# Patient Record
Sex: Female | Born: 1975 | Race: White | Hispanic: No | Marital: Married | Smoking: Current every day smoker
Health system: Southern US, Community
[De-identification: ages and names within clinical notes are randomized; demographics above are authoritative.]

## PROBLEM LIST (undated history)

## (undated) DIAGNOSIS — I2510 Atherosclerotic heart disease of native coronary artery without angina pectoris: Secondary | ICD-10-CM

## (undated) DIAGNOSIS — I10 Essential (primary) hypertension: Secondary | ICD-10-CM

## (undated) DIAGNOSIS — R569 Unspecified convulsions: Secondary | ICD-10-CM

## (undated) DIAGNOSIS — I509 Heart failure, unspecified: Secondary | ICD-10-CM

## (undated) HISTORY — PX: ABDOMINAL HYSTERECTOMY: SHX81

---

## 2005-07-12 ENCOUNTER — Emergency Department: Payer: Self-pay | Admitting: Emergency Medicine

## 2006-04-09 ENCOUNTER — Emergency Department: Payer: Self-pay | Admitting: Emergency Medicine

## 2006-05-07 ENCOUNTER — Ambulatory Visit: Payer: Self-pay | Admitting: Obstetrics and Gynecology

## 2006-05-13 ENCOUNTER — Inpatient Hospital Stay: Payer: Self-pay | Admitting: Obstetrics and Gynecology

## 2007-05-08 ENCOUNTER — Emergency Department: Payer: Self-pay | Admitting: Emergency Medicine

## 2007-07-01 ENCOUNTER — Emergency Department: Payer: Self-pay | Admitting: Emergency Medicine

## 2007-07-14 ENCOUNTER — Ambulatory Visit: Payer: Self-pay | Admitting: Urology

## 2007-09-16 ENCOUNTER — Emergency Department: Payer: Self-pay

## 2008-02-10 ENCOUNTER — Observation Stay: Payer: Self-pay | Admitting: Obstetrics and Gynecology

## 2008-07-04 ENCOUNTER — Emergency Department: Payer: Self-pay | Admitting: Emergency Medicine

## 2008-09-04 ENCOUNTER — Ambulatory Visit: Payer: Self-pay | Admitting: Family Medicine

## 2009-02-21 ENCOUNTER — Emergency Department: Payer: Self-pay | Admitting: Emergency Medicine

## 2009-03-07 ENCOUNTER — Encounter: Admission: RE | Admit: 2009-03-07 | Discharge: 2009-03-07 | Payer: Self-pay | Admitting: Surgery

## 2009-11-17 ENCOUNTER — Emergency Department: Payer: Self-pay | Admitting: Internal Medicine

## 2011-02-27 ENCOUNTER — Emergency Department: Payer: Self-pay | Admitting: Emergency Medicine

## 2011-07-05 ENCOUNTER — Emergency Department: Payer: Self-pay | Admitting: Unknown Physician Specialty

## 2011-11-22 ENCOUNTER — Encounter: Payer: Self-pay | Admitting: Emergency Medicine

## 2011-11-22 ENCOUNTER — Emergency Department (HOSPITAL_COMMUNITY)
Admission: EM | Admit: 2011-11-22 | Discharge: 2011-11-22 | Disposition: A | Discharge status: Home / Self Care | Payer: Self-pay | Attending: Emergency Medicine | Admitting: Emergency Medicine

## 2011-11-22 DIAGNOSIS — R109 Unspecified abdominal pain: Secondary | ICD-10-CM | POA: Insufficient documentation

## 2011-11-22 DIAGNOSIS — G8929 Other chronic pain: Secondary | ICD-10-CM | POA: Insufficient documentation

## 2011-11-22 DIAGNOSIS — R300 Dysuria: Secondary | ICD-10-CM | POA: Insufficient documentation

## 2011-11-22 DIAGNOSIS — F17200 Nicotine dependence, unspecified, uncomplicated: Secondary | ICD-10-CM | POA: Insufficient documentation

## 2011-11-22 DIAGNOSIS — Z79899 Other long term (current) drug therapy: Secondary | ICD-10-CM | POA: Insufficient documentation

## 2011-11-22 DIAGNOSIS — R110 Nausea: Secondary | ICD-10-CM | POA: Insufficient documentation

## 2011-11-22 DIAGNOSIS — R10819 Abdominal tenderness, unspecified site: Secondary | ICD-10-CM | POA: Insufficient documentation

## 2011-11-22 LAB — URINALYSIS, ROUTINE W REFLEX MICROSCOPIC
Bilirubin Urine: NEGATIVE
Nitrite: NEGATIVE
Specific Gravity, Urine: 1.005 (ref 1.005–1.030)
pH: 7 (ref 5.0–8.0)

## 2011-11-22 LAB — POCT I-STAT, CHEM 8
Creatinine, Ser: 1 mg/dL (ref 0.50–1.10)
Glucose, Bld: 92 mg/dL (ref 70–99)
Hemoglobin: 15 g/dL (ref 12.0–15.0)
TCO2: 27 mmol/L (ref 0–100)

## 2011-11-22 LAB — URINE MICROSCOPIC-ADD ON

## 2011-11-22 LAB — POCT PREGNANCY, URINE: Preg Test, Ur: NEGATIVE

## 2011-11-22 MED ORDER — CIPROFLOXACIN HCL 500 MG PO TABS: 500.0000 mg | ORAL_TABLET | Freq: Two times a day (BID) | ORAL | Status: AC

## 2011-11-22 MED ORDER — HYDROCODONE-ACETAMINOPHEN 5-325 MG PO TABS: 2.0000 | ORAL_TABLET | ORAL | Status: AC | PRN

## 2011-11-22 MED ORDER — HYDROCODONE-ACETAMINOPHEN 5-325 MG PO TABS
1.0000 | ORAL_TABLET | Freq: Once | ORAL | Status: AC
  Administered 2011-11-22: 1 via ORAL
  Filled 2011-11-22: qty 1

## 2011-11-22 NOTE — ED Provider Notes (Signed)
History     CSN: [id number] Arrival date & time: 11/22/2011  6:52 PM   First MD Initiated Contact with Patient 11/22/11 1958      Chief Complaint  Patient presents with  . Flank Pain    HPI  History provided by the patient. Patient presents with complaints of bilateral lower flank and back pains and suprapubic pressure with associated dysuria for the past one to 2 months. She reports history of the same for many many years with recurrent kidney infections. She states she has been evaluated for this by urology specialist in [place] and at [facility]. She had some testing done but lost medical insurance and has not followed up for the past 6 months to one year. Patient reports that symptoms have gradually been increasing. She feels a off-and-on pressure and burning sensation. She denies any fever, chills, sweats, nausea or vomiting. Symptoms are worse with the first urination in the morning after waking up. She states she is to try to drink plenty of fluids and took naproxen without significant improvement. She denies any other alleviating factors. Patient has no other significant past medical history.   Past Medical History  Diagnosis Date  . Kidney infection     History reviewed. No pertinent past surgical history.  History reviewed. No pertinent family history.  History  Substance Use Topics  . Smoking status: Current Everyday Smoker  . Smokeless tobacco: Not on file  . Alcohol Use: No    OB History    Grav Para Term Preterm Abortions TAB SAB Ect Mult Living                  Review of Systems  Constitutional: Negative for fever and chills.  Respiratory: Negative for cough.   Gastrointestinal: Positive for nausea and abdominal pain. Negative for vomiting, diarrhea and constipation.  Genitourinary: Positive for dysuria and flank pain. Negative for frequency, hematuria, vaginal bleeding and vaginal discharge.  All other systems reviewed and are  negative.    Allergies  Amoxicillin and Penicillins  Home Medications   Current Outpatient Rx  Name Route Sig Dispense Refill  . ESTRADIOL 0.5 MG PO TABS Oral Take 0.5 mg by mouth at bedtime.      Marland Kitchen MEDROXYPROGESTERONE ACETATE 2.5 MG PO TABS Oral Take 2.5 mg by mouth at bedtime.      Marland Kitchen NAPROXEN 500 MG PO TABS Oral Take 500 mg by mouth 2 (two) times daily with a meal.        BP 133/80  Pulse 111  Temp(Src) 98.2 F (36.8 C) (Oral)  Resp 18  SpO2 100%  Physical Exam  Nursing note and vitals reviewed. Constitutional: She is oriented to person, place, and time. She appears well-developed and well-nourished. No distress.  HENT:  Head: Normocephalic.  Mouth/Throat: Oropharynx is clear and moist.  Cardiovascular: Normal rate, regular rhythm and normal heart sounds.   Pulmonary/Chest: Effort normal and breath sounds normal. She has no wheezes. She has no rales.  Abdominal: Soft. Bowel sounds are normal. There is tenderness in the suprapubic area. There is no rebound, no guarding and no CVA tenderness.  Musculoskeletal: She exhibits no edema and no tenderness.  Neurological: She is alert and oriented to person, place, and time.  Skin: Skin is warm. No rash noted.  Psychiatric: She has a normal mood and affect. Her behavior is normal.    ED Course  Procedures (including critical care time)  Labs Reviewed  URINALYSIS, ROUTINE W REFLEX MICROSCOPIC - Abnormal;  Notable for the following:    Color, Urine STRAW (*)    APPearance CLOUDY (*)    Hgb urine dipstick SMALL (*)    Leukocytes, UA TRACE (*)    All other components within normal limits  URINE MICROSCOPIC-ADD ON - Abnormal; Notable for the following:    Squamous Epithelial / LPF MANY (*)    Bacteria, UA MANY (*)    All other components within normal limits  POCT PREGNANCY, URINE  POCT I-STAT, CHEM 8  LAB REPORT - SCANNED    Results for orders placed during the hospital encounter of 11/22/11  URINALYSIS, ROUTINE W REFLEX  MICROSCOPIC      Component Value Range   Color, Urine STRAW (*) YELLOW    APPearance CLOUDY (*) CLEAR    Specific Gravity, Urine 1.005  1.005 - 1.030    pH 7.0  5.0 - 8.0    Glucose, UA NEGATIVE  NEGATIVE (mg/dL)   Hgb urine dipstick SMALL (*) NEGATIVE    Bilirubin Urine NEGATIVE  NEGATIVE    Ketones, ur NEGATIVE  NEGATIVE (mg/dL)   Protein, ur NEGATIVE  NEGATIVE (mg/dL)   Urobilinogen, UA 1.0  0.0 - 1.0 (mg/dL)   Nitrite NEGATIVE  NEGATIVE    Leukocytes, UA TRACE (*) NEGATIVE   POCT PREGNANCY, URINE      Component Value Range   Preg Test, Ur NEGATIVE    URINE MICROSCOPIC-ADD ON      Component Value Range   Squamous Epithelial / LPF MANY (*) RARE    WBC, UA 3-6  <3 (WBC/hpf)   RBC / HPF 3-6  <3 (RBC/hpf)   Bacteria, UA MANY (*) RARE   POCT I-STAT, CHEM 8      Component Value Range   Sodium 143  135 - 145 (mEq/L)   Potassium 3.7  3.5 - 5.1 (mEq/L)   Chloride 104  96 - 112 (mEq/L)   BUN 8  6 - 23 (mg/dL)   Creatinine, Ser 1.61  0.50 - 1.10 (mg/dL)   Glucose, Bld 92  70 - 99 (mg/dL)   Calcium, Ion 0.96  0.45 - 1.32 (mmol/L)   TCO2 27  0 - 100 (mmol/L)   Hemoglobin 15.0  12.0 - 15.0 (g/dL)   HCT 40.9  81.1 - 91.4 (%)      1. Dysuria   2. Chronic pain       MDM  8:30 patient seen and evaluated. Patient in no acute distress.   Pt feeling better.  Pt with no clear signs of UTI today.   Pt with hx of similar symptoms for many months.  Pt has been worked up for same complaints.  Pt has stable vital signs.  Her exam is benign and do not suspect any acute or emergent process.     Angus Seller, [place] 11/23/11 2158

## 2011-11-22 NOTE — ED Notes (Signed)
Rx given x2 D/c instructions reviewed w/ pt and family - pt and family deny any further questions or concerns at present.  

## 2011-11-22 NOTE — ED Notes (Signed)
Pt reports hx of kidney infections - reports foul smelling urine x1 month - states she feeling like she has a kidney infection however "its just not showing up in my urine." Pt denies fever, hematuria, or n/v. Pt admits to dysuria and bilat flank pain.

## 2011-11-22 NOTE — ED Notes (Signed)
Bilateral flank pain x 1 1/2 months.  Reports urine has strong odor.

## 2011-11-25 NOTE — ED Provider Notes (Signed)
Medical screening examination/treatment/procedure(s) were performed by non-physician practitioner and as supervising physician I was immediately available for consultation/collaboration.   Gwyneth Sprout, MD 11/25/11 [phone]

## 2012-07-21 ENCOUNTER — Emergency Department: Payer: Self-pay | Admitting: Emergency Medicine

## 2012-08-23 ENCOUNTER — Emergency Department: Payer: Self-pay | Admitting: Emergency Medicine

## 2012-08-25 ENCOUNTER — Emergency Department: Payer: Self-pay | Admitting: Emergency Medicine

## 2012-08-25 LAB — CBC WITH DIFFERENTIAL/PLATELET
Basophil #: 0.1 10*3/uL (ref 0.0–0.1)
Eosinophil #: 0.3 10*3/uL (ref 0.0–0.7)
HCT: 43.9 % (ref 35.0–47.0)
HGB: 14.9 g/dL (ref 12.0–16.0)
Lymphocyte #: 2.3 10*3/uL (ref 1.0–3.6)
MCH: 31.4 pg (ref 26.0–34.0)
MCHC: 33.9 g/dL (ref 32.0–36.0)
Monocyte #: 1 x10 3/mm — ABNORMAL HIGH (ref 0.2–0.9)
Neutrophil %: 54 %
Platelet: 220 10*3/uL (ref 150–440)
RBC: 4.76 10*6/uL (ref 3.80–5.20)
RDW: 12.7 % (ref 11.5–14.5)

## 2012-08-25 LAB — COMPREHENSIVE METABOLIC PANEL
Anion Gap: 8 (ref 7–16)
BUN: 7 mg/dL (ref 7–18)
Bilirubin,Total: 0.7 mg/dL (ref 0.2–1.0)
Chloride: 104 mmol/L (ref 98–107)
EGFR (African American): >60
Potassium: 3.2 mmol/L — ABNORMAL LOW (ref 3.5–5.1)
SGOT(AST): 17 U/L (ref 15–37)
Total Protein: 8.4 g/dL — ABNORMAL HIGH (ref 6.4–8.2)

## 2012-08-25 LAB — TSH: Thyroid Stimulating Horm: 0.67 u[IU]/mL

## 2012-12-01 ENCOUNTER — Emergency Department: Payer: Self-pay | Admitting: Emergency Medicine

## 2013-10-07 ENCOUNTER — Emergency Department: Payer: Self-pay | Admitting: Emergency Medicine

## 2014-10-25 ENCOUNTER — Emergency Department: Payer: Self-pay | Admitting: Student

## 2015-02-13 ENCOUNTER — Inpatient Hospital Stay: Payer: Self-pay | Admitting: Internal Medicine

## 2015-04-14 NOTE — H&P (Signed)
PATIENT NAME:  Tami Davis, Tami Davis MR#:  [id number] DATE OF BIRTH:  10-19-1976  DATE OF ADMISSION:  02/13/2015  PRIMARY CARE PHYSICIAN:  Dr. Dale Mayfield.  CHIEF COMPLAINT: Chest pain.   HISTORY OF PRESENTING ILLNESS: This is a 39 year old female who had hysterectomy and oophorectomy done because of endometriosis and she is on estrogen and progesterone replacement for the last 4 years. She has been a smoker for almost like 17-18 years now, smokes half pack cigarettes every day and her father had coronary artery disease at the age of 30.   Complains that for the last 2 to 3 days she is feeling pressure-like chest pain, which is in the center of the chest and sometimes it radiates to her left arm and left arm feels numb while trying to hold something occasionally also. She also feels excessive sweating and uneasiness with that and so finally decided to come to Emergency Room for further evaluation of this complaint. On further description she says that the pain is like somebody is sitting on my chest when I lie down flat in the bed and I feel a little bit better on turning to one side or sitting up but the pain never goes away, it just stays there. She feels a little bit short of breath, but denies any wheezing. She has slight worsening in her regular smokers cough and there is a little bit sputum production with that also but denies any chills, fever, or weakness, denies any palpitation.   On arrival to ER she was noted to have slight tachycardia, which resolved with IV fluid. Troponin was noted to be 0.5.  Other lab reports came out almost nearly normal or negative and so she was suspected to have non-ST-elevation MI or pericarditis and given to hospitalist team for further management of this issue. On further questioning, she also describes that in the last 7 to 8 months she had 2 episodes of suspected seizures. The first one she noted  after having some kind of dental procedure and medication for that.   Within 1 or 2 days at home she passed out and husband was there, he saw her completely unresponsive, then her eyes rolled up and she threw up having projectile vomiting. They tried to schedule an appointment to [facility] neurologic doctors but could not see them for months and meanwhile almost 1-1/2 months ago she had another episode, similar to the one when she was alone at home.  Never had any history of seizures or stroke.   REVIEW OF SYSTEMS:   CONSTITUTIONAL: Negative for fever, fatigue, weakness, pain or weight loss.  EYES: No blurring, double vision, discharge or redness.  EARS, NOSE, THROAT: No tinnitus, ear pain, or hearing loss.  RESPIRATORY: No cough, wheezing, hemoptysis, or shortness of breath.  CARDIOVASCULAR: The patient has some chest pain, but no palpitation, edema, arrhythmia.  GASTROINTESTINAL: No nausea, vomiting, diarrhea, abdominal pain.  GENITOURINARY: No dysuria, hematuria, increased frequency.  ENDOCRINE: No heat or cold intolerance. No excessive sweating.  SKIN: No acne, rashes, or lesions.  MUSCULOSKELETAL: No pain or swelling in the joints.  NEUROLOGICAL: No numbness, weakness, tremor or vertigo.  PSYCHIATRY: No anxiety, insomnia, or bipolar disorder.   PAST MEDICAL HISTORY:  1.  Hysterectomy and oophorectomy because of endometriosis and she is on hormonal replacement therapy for the last 4 years because of menopausal symptoms.  2.  Myalgia, taking gabapentin for that.   SURGICAL HISTORY:  GYN surgeries as mentioned above.   SOCIAL HISTORY: She  smokes 1/2 pack of cigarettes every day. Continues to smoke with her hormone replacement therapy.  Denies drinking alcohol or do illegal drug use. She is a housewife.   FAMILY HISTORY: Positive for father having MI at age of 81. Multiple family members have diabetes in family.   HOME MEDICATIONS:  1.  Medroxyprogesterone 1 tablet once a day.  2.  Gabapentin 800 mg oral once a day.  3.  Estradiol 1 mg oral tablet  once a day.   PHYSICAL EXAMINATION:  VITALS: In ER, temperature 98.7, pulse 118, came down to 94, respiration 18, blood pressure 123/76, pulse oximetry 99 on room air.  GENERAL: The patient is fully alert and oriented to time, place, and person. Does not appear in any acute distress.  HEENT: Head and neck atraumatic. Conjunctivae pink. Sclerae anicteric. Oral mucosa moist.  NECK: Supple. Thyroid nontender. Pupils equally reactive to light. No JVD.  CARDIOVASCULAR: S1, S2 present, regular. No murmur appreciated. No tenderness on local palpation.  ABDOMEN: Soft, nontender. No organomegaly, bowel sounds normal.  RESPIRATORY: Bilateral equal and clear air entry. No crepitation or wheezing.  SKIN: No acne, rashes, or lesions, well hydrated.  LEGS: No edema.  JOINTS: No swelling or tenderness.  NEUROLOGICAL: Power 5 out of 5, follows commands and moves all 4 limbs. Sensation is intact and reflexes intact. No tremor or rigidity.  PSYCHIATRIC: Does not appear in any acute psychiatric illness at this time.   IMPORTANT LABORATORY RESULTS:  Troponin 0.51, WBC 6.6, hemoglobin 16, platelet count 200. MCV is 95, glucose 108, BUN 10, creatinine 1.08, sodium 139, potassium 3.8, chloride is 104, and CO2 is 27. Chest x-ray, PA and lateral, shows heart size and mediastinal contour are within normal limits. Both lungs are clear, negative exam. D-dimer 221. EKG reviewed by me. There is ST-T wave inversion in lateral leads which is new compared with the previous one in December 2013.   ASSESSMENT AND PLAN: A 39 year old female smoker on hormone replacement therapy and father had history of myocardial infarction at 38 years of age came to hospital with chest pain and found having elevated troponin and some electrocardiogram changes.  1.  Non-ST elevation myocardial infarction. We will admit to telemetry, follow serial troponins and monitor on telemetry, check her lipid panel, start on statin and aspirin and we will give  Lovenox therapeutic dose for now. I spoke to Dr. Juliann Pares about her case and as she is a very high risk of having coronary artery disease, he would like to anticoagulate her fully and get an echocardiogram to begin  with.   2.  Chest pain, which varies with position. There is also a concern of having pericarditis, but as patient is having very strong and high risk for coronary artery disease, after discussing with Dr. Juliann Pares suggested just to go and start with anticoagulation and also give her some ibuprofen to help with the pain.  3.  Hormone replacement therapy. The patient is on this after hysterectomy and oophorectomy for the last 4 years. I counseled her strongly not to smoke while taking hormone replacement therapy or stop taking hormone replacement. She decided to stop smoking for now and we will discuss about hormone replacement therapy with her doctor, but would like to continue that as she is more worried about gray hairs.  4.  Smoking.  Smoking cessation counseling is done for 4 minutes and offered her a nicotine patch. She agreed not to smoke but she does not want any replacement therapy currently.  TOTAL TIME SPENT ON THIS ADMISSION:  50 minutes.    ____________________________ Hope PigeonVaibhavkumar G. Elisabeth PigeonVachhani, MD vgv:at D: 02/13/2015 21:30:33 ET T: 02/13/2015 21:50:26 ET JOB#: [id number]  cc: Hope PigeonVaibhavkumar G. Elisabeth PigeonVachhani, MD, <Dictator> Dale Durhamharlene Scott, MD  Altamese DillingVAIBHAVKUMAR Brenten Janney MD ELECTRONICALLY SIGNED 02/28/2015 11:45

## 2015-04-14 NOTE — Consult Note (Signed)
PATIENT NAME:  Tami Davis, [name] MR#:  [id number] DATE OF BIRTH:  [birth year]  DATE OF CONSULTATION:  02/14/2015  REFERRING PHYSICIAN:  Altamese DillingVaibhavkumar Vachhani, MD CONSULTING PHYSICIAN:  Marcina MillardAlexander Woodie Trusty, MD  PRIMARY CARE PHYSICIAN: Dale Durhamharlene Scott, MD  CHIEF COMPLAINT: Chest pain.   HISTORY OF PRESENT ILLNESS: The patient is a 39 year old female with history of long-standing tobacco abuse who presents with prolonged episode of chest discomfort. The patient reports episodes of chest pain off and on for the past year. During the past 2 to 3 days she has had more severe substernal chest discomfort described as a heaviness sensation associated with tingling to her left shoulder and down her left arm. She presented to [facility] Emergency Room where EKG was nondiagnostic. The patient was admitted to telemetry where she was noted to have elevated troponin. Troponin was 1.5. The patient reports feeling better today with less chest discomfort.   The patient has also had a history of syncopal episodes. She has been evaluated by neurology for possible seizure activity. Her last syncopal episode occurred approximately 1 month ago.   PAST MEDICAL HISTORY: 1.  Endometriosis, status post hysterectomy and oophorectomy, on estrogen replacement therapy. 2.  Myalgias.  3.  Syncope, possible seizure disorder.  4.  Tobacco abuse.   MEDICATIONS: Medroxyprogesterone 1 tab daily, gabapentin 800 mg daily, estradiol 1 mg daily.   SOCIAL HISTORY: The patient is married and resides with her husband. Smokes 1/2 pack of cigarettes a day.   FAMILY HISTORY: Father is status post MI at age 945.   REVIEW OF SYSTEMS: CONSTITUTIONAL: No fever or chills.  EYES: No blurry vision.  EARS: No hearing loss.  RESPIRATORY: No shortness of breath.  CARDIOVASCULAR: Chest pain as described above.  GASTROINTESTINAL: No nausea, vomiting, or diarrhea.  GENITOURINARY: No dysuria or hematuria.  ENDOCRINE: No polyuria or polydipsia.   MUSCULOSKELETAL: No arthralgias or myalgias.  NEUROLOGICAL: The patient has had 2 syncopal episodes, questionable seizure activity.   PHYSICAL EXAMINATION: VITAL SIGNS: Blood pressure 91/62, pulse 83, temperature 98.1, pulse ox 96%.  HEENT: Pupils equal and reactive to light and accommodation.  NECK: Supple without thyromegaly.  LUNGS: Clear.  HEART: Normal JVP. Normal PMI. Regular rate and rhythm. Normal S1, S2. No appreciable gallop, murmur, or rub.  ABDOMEN: Soft and nontender. Pulses were intact bilaterally.  MUSCULOSKELETAL: Normal muscle tone.  NEUROLOGIC: The patient is alert and oriented x3. Motor and sensory are both grossly.   IMPRESSION: A 39 year old female who presents with 1 to 2 day history of increase in chest pain with elevated troponin consistent with non-ST elevation myocardial infarction. The patient currently clinically stable.   RECOMMENDATIONS: 1.  Agree with overall current therapy.  2.  Agree with anticoagulation. 3.  Review 2-D echocardiogram.  4.  Proceed with cardiac catheterization with selective coronary arteriography scheduled for 02/15/2015. The risks, benefits and alternatives were explained and informed written consent obtained.   ____________________________ Marcina MillardAlexander Adarius Tigges, MD ap:sb D: 02/14/2015 08:57:06 ET T: 02/14/2015 13:41:03 ET JOB#: [id number]  cc: Marcina MillardAlexander Dorisann Schwanke, MD, <Dictator> Marcina MillardALEXANDER Brodey Bonn MD ELECTRONICALLY SIGNED 02/19/2015 10:09

## 2015-04-14 NOTE — Discharge Summary (Signed)
PATIENT NAME:  Tami Davis, Tami Davis MR#:  [id number] DATE OF BIRTH:  1976/01/14  DATE OF ADMISSION:  02/13/2015 DATE OF DISCHARGE:  02/15/2015  ADMITTING PHYSICIAN: Tami PigeonVaibhavkumar G. Elisabeth PigeonVachhani, MD.   DISCHARGING PHYSICIAN: Tami Baasadhika Tahj Lindseth, MD.   PRIMARY CARE PHYSICIAN: Tami SatoLinda M. Miles, MD.   CONSULTATIONS IN THE HOSPITAL: Cardiology consultation with Dr. Darrold Davis.   DISCHARGE DIAGNOSES: 1. Acute bronchitis.  2. Apical dyskinesis, stress-induced cardiomyopathy. 3. Tobacco use disorder.  4. Recurrent syncope.   DISCHARGE HOME MEDICATIONS:  1. Gabapentin 800 mg p.o. daily.  2. Lisinopril 2.5 mg p.o. daily.  3. Atorvastatin 20 mg p.o. at bedtime.  4. Aspirin 81 mg p.o. daily.  5. Toprol 25 mg p.o. daily.  6. Levaquin 500 mg p.o. daily.  7. Percocet 5/325 mg 1 tablet every 6 hours p.Davis.n. for pain.   DISCHARGE DIET: Regular diet.   DISCHARGE ACTIVITY: As tolerated.    FOLLOWUP INSTRUCTIONS: 1. Cardiology follow-up in 4 weeks.  2. PCP follow-up in 2 weeks.  3. Advised to get referral from PCPs for outpatient neurology follow-up for recurrent syncope and seizures.  4. OB/GYN appointment in 2 weeks to see if hormonal pills can be restarted.   LABORATORIES AND IMAGING STUDIES: Prior to discharge: WBC 5.3, hemoglobin 14.3, hematocrit 43.6, platelet count 173,000. Sodium 140, potassium 3.5, chloride 107, bicarbonate 27, BUN 8, creatinine 0.89, glucose 94 and calcium of 8.5. LDL cholesterol 89, HDL cholesterol 36, total cholesterol 140, triglycerides of 73. TSH is 1.97.   Echo Doppler showing LV ejection fraction is 55% to 60%. Cardiac catheterization revealing apical dyskinesis, EF is 50%. 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   BRIEF HOSPITAL COURSE: Tami Davis is a 39 year old, young, Caucasian female with past medical history significant for premature menopause on hormonal supplements, history of smoking and a strong family history of cardiac disease, presents  to the hospital secondary to chest pain and cough.  1. Acute bronchitis causing chest pain and cough; started on Levaquin. No fevers or chills in the hospital. No wheezing noted.  2. Apical dyskinesis, stress-induced cardiomyopathy. Initially thought to be cardiac kind of chest pain with her family history and also her being on hormonal supplements. She was seen by Dr. Darrold Davis, had underwent cardiac catheterization which showed that she has stress-induced cardiomyopathy, apical ballooning, EF is still good at 50%. She was started on metoprolol, lisinopril, aspirin and statin and will have outpatient follow-up with cardiology. There is no other significant coronary disease. She was advised to stay off of the hormonal medications at this time.  3. Recurrent syncope, seizure. Outpatient done neurology follow-up. No cardiac issues.  4. Her course has been otherwise uneventful in the hospital.   DISCHARGE CONDITION: Stable.   DISCHARGE DISPOSITION: Home.   TIME SPENT ON DISCHARGE: 40 minutes.    ____________________________ Tami Baasadhika Keondria Siever, MD rk:TT D: 02/23/2015 13:27:50 ET T: 02/23/2015 17:35:18 ET JOB#: [id number]  cc: Tami Baasadhika Finley Dinkel, MD, <Dictator> Tami SatoLinda M. Miles, MD Tami MillardAlexander Paraschos, MD  Tami BaasADHIKA Ilo Beamon MD ELECTRONICALLY SIGNED 02/28/2015 18:09

## 2015-08-26 ENCOUNTER — Emergency Department: Payer: Medicaid Other

## 2015-08-26 ENCOUNTER — Emergency Department
Admission: EM | Admit: 2015-08-26 | Discharge: 2015-08-26 | Disposition: A | Discharge status: Home / Self Care | Payer: Medicaid Other | Attending: Emergency Medicine | Admitting: Emergency Medicine

## 2015-08-26 ENCOUNTER — Encounter: Payer: Self-pay | Admitting: Emergency Medicine

## 2015-08-26 DIAGNOSIS — Z720 Tobacco use: Secondary | ICD-10-CM | POA: Insufficient documentation

## 2015-08-26 DIAGNOSIS — I10 Essential (primary) hypertension: Secondary | ICD-10-CM | POA: Insufficient documentation

## 2015-08-26 DIAGNOSIS — Z79899 Other long term (current) drug therapy: Secondary | ICD-10-CM | POA: Insufficient documentation

## 2015-08-26 DIAGNOSIS — Z880 Allergy status to penicillin: Secondary | ICD-10-CM | POA: Diagnosis not present

## 2015-08-26 DIAGNOSIS — G8929 Other chronic pain: Secondary | ICD-10-CM | POA: Insufficient documentation

## 2015-08-26 DIAGNOSIS — M79601 Pain in right arm: Secondary | ICD-10-CM | POA: Insufficient documentation

## 2015-08-26 DIAGNOSIS — Z791 Long term (current) use of non-steroidal anti-inflammatories (NSAID): Secondary | ICD-10-CM | POA: Diagnosis not present

## 2015-08-26 DIAGNOSIS — M549 Dorsalgia, unspecified: Secondary | ICD-10-CM | POA: Diagnosis not present

## 2015-08-26 DIAGNOSIS — M25512 Pain in left shoulder: Secondary | ICD-10-CM | POA: Diagnosis not present

## 2015-08-26 HISTORY — DX: Heart failure, unspecified: I50.9

## 2015-08-26 HISTORY — DX: Atherosclerotic heart disease of native coronary artery without angina pectoris: I25.10

## 2015-08-26 HISTORY — DX: Unspecified convulsions: R56.9

## 2015-08-26 HISTORY — DX: Essential (primary) hypertension: I10

## 2015-08-26 LAB — TROPONIN I: Troponin I: <0.03 ng/mL (ref <0.031)

## 2015-08-26 LAB — CBC
HCT: 45.8 % (ref 35.0–47.0)
HEMOGLOBIN: 15.6 g/dL (ref 12.0–16.0)
MCH: 31.8 pg (ref 26.0–34.0)
MCHC: 34.1 g/dL (ref 32.0–36.0)
MCV: 93.3 fL (ref 80.0–100.0)
PLATELETS: 209 10*3/uL (ref 150–440)
RBC: 4.9 MIL/uL (ref 3.80–5.20)
RDW: 12.5 % (ref 11.5–14.5)
WBC: 8 10*3/uL (ref 3.6–11.0)

## 2015-08-26 LAB — COMPREHENSIVE METABOLIC PANEL
ALBUMIN: 4.5 g/dL (ref 3.5–5.0)
ALK PHOS: 86 U/L (ref 38–126)
ALT: 18 U/L (ref 14–54)
AST: 30 U/L (ref 15–41)
Anion gap: 8 (ref 5–15)
BUN: 7 mg/dL (ref 6–20)
CALCIUM: 9.5 mg/dL (ref 8.9–10.3)
CHLORIDE: 104 mmol/L (ref 101–111)
CO2: 28 mmol/L (ref 22–32)
CREATININE: 0.84 mg/dL (ref 0.44–1.00)
GFR calc non Af Amer: >60 mL/min (ref >60)
GLUCOSE: 89 mg/dL (ref 65–99)
Potassium: 3.7 mmol/L (ref 3.5–5.1)
SODIUM: 140 mmol/L (ref 135–145)
Total Bilirubin: 1.1 mg/dL (ref 0.3–1.2)
Total Protein: 7.6 g/dL (ref 6.5–8.1)

## 2015-08-26 NOTE — ED Provider Notes (Signed)
[facility] Emergency Department Provider Note  ____________________________________________  Time seen: Approximately 5:14 PM  I have reviewed the triage vital signs and the nursing notes.   HISTORY  Chief Complaint Shoulder Pain    HPI Tami Davis is a 39 y.o. female with a history of stress-induced cardiomyopathy recently with a slightly elevated troponin, chronic pain in her back, and a fall down some steps resulting in a left shoulder injuryabout 2 months ago who presents with persistent pain in her left shoulder that occasionally radiates down her left arm.  She states that she wants to make sure it is "not another heart attack".  She denies chest pain, shortness of breath, nausea, vomiting.  She states that the pain is reproduced and worsened with movement of the left arm and shoulder.  It improves with not moving the shoulder.  She states that if she had not had her heart attack she would not be worried about it.  She has not followed up with an orthopedic surgeon after a fall.  She reportedly had some abnormal findings on a shoulder x-ray.  She reports the pain is moderate and helped somewhat with over-the-counter pain medicine.   Past Medical History  Diagnosis Date  . CHF (congestive heart failure)   . Coronary artery disease   . Hypertension   . Seizures     There are no active problems to display for this patient.   Past Surgical History  Procedure Laterality Date  . Abdominal hysterectomy    . Cesarean section      Current Outpatient Rx  Name  Route  Sig  Dispense  Refill  . estradiol (ESTRACE) 0.5 MG tablet   Oral   Take 0.5 mg by mouth at bedtime.           . medroxyPROGESTERone (PROVERA) 2.5 MG tablet   Oral   Take 2.5 mg by mouth at bedtime.           . naproxen (NAPROSYN) 500 MG tablet   Oral   Take 500 mg by mouth 2 (two) times daily with a meal.             Allergies Amoxicillin; Penicillins; and Sulfa  antibiotics  No family history on file.  Social History Social History  Substance Use Topics  . Smoking status: Current Every Day Smoker  . Smokeless tobacco: None  . Alcohol Use: No    Review of Systems Constitutional: No fever/chills Eyes: No visual changes. ENT: No sore throat. Cardiovascular: Denies chest pain. Respiratory: Denies shortness of breath. Gastrointestinal: No abdominal pain.  No nausea, no vomiting.  No diarrhea.  No constipation. Genitourinary: Negative for dysuria. Musculoskeletal: Reproducible pain and left shoulder Skin: Negative for rash. Neurological: Negative for headaches, focal weakness or numbness.  10-point ROS otherwise negative.  ____________________________________________   PHYSICAL EXAM:  VITAL SIGNS: ED Triage Vitals  Enc Vitals Group     BP 08/26/15 1352 121/75 mmHg     Pulse Rate 08/26/15 1352 99     Resp 08/26/15 1352 18     Temp 08/26/15 1352 98.2 F (36.8 C)     Temp Source 08/26/15 1352 Oral     SpO2 08/26/15 1352 99 %     Weight 08/26/15 1352 125 lb (56.7 kg)     Height --      Head Cir --      Peak Flow --      Pain Score --  Pain Loc --      Pain Edu? --      Excl. in GC? --     Constitutional: Alert and oriented. Well appearing and in no acute distress. Eyes: Conjunctivae are normal. PERRL. EOMI. Head: Atraumatic. Nose: No congestion/rhinnorhea. Mouth/Throat: Mucous membranes are moist.  Oropharynx non-erythematous. Neck: No stridor.   Cardiovascular: Normal rate, regular rhythm. Grossly normal heart sounds.  Good peripheral circulation. Respiratory: Normal respiratory effort.  No retractions. Lungs CTAB. Gastrointestinal: Soft and nontender. No distention. No abdominal bruits. No CVA tenderness. Musculoskeletal: Highly reproducible left shoulder pain with range of motion and tenderness to palpation throughout the left shoulder.  No obvious injuries or deformities.  Limited ability to abduct the left shoulder.   No tenderness to palpation of the chest.  Normal ability to reach across the body to the right arm.  No evidence of dislocation. Neurologic:  Normal speech and language. No gross focal neurologic deficits are appreciated.  Skin:  Skin is warm, dry and intact. No rash noted.   ____________________________________________   LABS (all labs ordered are listed, but only abnormal results are displayed)  Labs Reviewed  CBC  COMPREHENSIVE METABOLIC PANEL  TROPONIN I   ____________________________________________  EKG  ED ECG REPORT I, Sparkles Mcneely, the attending physician, personally viewed and interpreted this ECG.   Date: 08/26/2015  EKG Time: 14:04  Rate: 105  Rhythm: sinus tachycardia  Axis: Normal  Intervals:Normal  ST&T Change: Artifact is present, but there is no evidence of acute ischemia  ____________________________________________  RADIOLOGY   Dg Shoulder Left  08/26/2015   CLINICAL DATA:  Initial encounter for left shoulder pain radiates down the arm since falling down stairs 1 month ago with recent worsening over the last few days.  EXAM: LEFT SHOULDER - 2+ VIEW  COMPARISON:  10/25/2014.  FINDINGS: There is no evidence of fracture or dislocation. There is no evidence of arthropathy or other focal bone abnormality. Soft tissues are unremarkable.  IMPRESSION: Negative.   Electronically Signed   By: Kennith Center M.D.   On: 08/26/2015 18:41    ____________________________________________   PROCEDURES  Procedure(s) performed: None  Critical Care performed: No ____________________________________________   INITIAL IMPRESSION / ASSESSMENT AND PLAN / ED COURSE  Pertinent labs & imaging results that were available during my care of the patient were reviewed by me and considered in my medical decision making (see chart for details).  The patient denies chest pain and has no evidence of acute MI.  She has chronic left shoulder pain and needs follow-up with an  orthopedic surgeon.  I provided reassurance and encouraged outpatient follow-up.  I also gave her my usual and customary return precautions.  ____________________________________________  FINAL CLINICAL IMPRESSION(S) / ED DIAGNOSES  Final diagnoses:  Left shoulder pain      NEW MEDICATIONS STARTED DURING THIS VISIT:  Discharge Medication List as of 08/26/2015  7:02 PM       Loleta Rose, MD 08/27/15 1502

## 2015-08-26 NOTE — ED Notes (Signed)
Pt ambulating independently w/ steady gait on d/c in no acute distress, A&Ox4.D/c instructions reviewed w/ pt and family - pt and family deny any further questions or concerns at present.  

## 2015-08-26 NOTE — ED Notes (Signed)
Lab notified of add on troponin.

## 2015-08-26 NOTE — ED Notes (Signed)
Patient transported to X-ray 

## 2015-08-26 NOTE — Discharge Instructions (Signed)
As we discussed, your workup today was reassuring.  Though we do not know exactly what is causing your symptoms, it appears that you have no emergent medical condition at this time are safe to go home and follow up as recommended in this paperwork.  Please return immediately to the Emergency Department if you develop any new or worsening symptoms that concern you.   Shoulder Pain The shoulder is the joint that connects your arms to your body. The bones that form the shoulder joint include the upper arm bone (humerus), the shoulder blade (scapula), and the collarbone (clavicle). The top of the humerus is shaped like a ball and fits into a rather flat socket on the scapula (glenoid cavity). A combination of muscles and strong, fibrous tissues that connect muscles to bones (tendons) support your shoulder joint and hold the ball in the socket. Small, fluid-filled sacs (bursae) are located in different areas of the joint. They act as cushions between the bones and the overlying soft tissues and help reduce friction between the gliding tendons and the bone as you move your arm. Your shoulder joint allows a wide range of motion in your arm. This range of motion allows you to do things like scratch your back or throw a ball. However, this range of motion also makes your shoulder more prone to pain from overuse and injury. Causes of shoulder pain can originate from both injury and overuse and usually can be grouped in the following four categories:  Redness, swelling, and pain (inflammation) of the tendon (tendinitis) or the bursae (bursitis).  Instability, such as a dislocation of the joint.  Inflammation of the joint (arthritis).  Broken bone (fracture). HOME CARE INSTRUCTIONS   Apply ice to the sore area.  Put ice in a plastic bag.  Place a towel between your skin and the bag.  Leave the ice on for 15-20 minutes, 3-4 times per day for the first 2 days, or as directed by your health care  provider.  Stop using cold packs if they do not help with the pain.  If you have a shoulder sling or immobilizer, wear it as long as your caregiver instructs. Only remove it to shower or bathe. Move your arm as little as possible, but keep your hand moving to prevent swelling.  Squeeze a soft ball or foam pad as much as possible to help prevent swelling.  Only take over-the-counter or prescription medicines for pain, discomfort, or fever as directed by your caregiver. SEEK MEDICAL CARE IF:   Your shoulder pain increases, or new pain develops in your arm, hand, or fingers.  Your hand or fingers become cold and numb.  Your pain is not relieved with medicines. SEEK IMMEDIATE MEDICAL CARE IF:   Your arm, hand, or fingers are numb or tingling.  Your arm, hand, or fingers are significantly swollen or turn white or blue. MAKE SURE YOU:   Understand these instructions.  Will watch your condition.  Will get help right away if you are not doing well or get worse. Document Released: 09/09/2005 Document Revised: 04/16/2014 Document Reviewed: 11/14/2011 [facility] Patient Information 2015 [place], [place]. This information is not intended to replace advice given to you by your health care provider. Make sure you discuss any questions you have with your health care provider.  Shoulder Range of Motion Exercises The shoulder is the most flexible joint in the human body. Because of this it is also the most unstable joint in the body. All ages can develop  shoulder problems. Early treatment of problems is necessary for a good outcome. People react to shoulder pain by decreasing the movement of the joint. After a brief period of time, the shoulder can become "frozen". This is an almost complete loss of the ability to move the damaged shoulder. Following injuries your caregivers can give you instructions on exercises to keep your range of motion (ability to move your shoulder freely), or regain it if it has  been lost.  EXERCISES EXERCISES TO MAINTAIN THE MOBILITY OF YOUR SHOULDER: Codman's Exercise or Pendulum Exercise  This exercise may be performed in a prone (face-down) lying position or standing while leaning on a chair with the opposite arm. Its purpose is to relax the muscles in your shoulder and slowly but surely increase the range of motion and to relieve pain.  Lie on your stomach close to the side edge of the bed. Let your weak arm hang over the edge of the bed. Relax your shoulder, arm and hand. Let your shoulder blade relax and drop down.  Slowly and gently swing your arm forward and back. Do not use your neck muscles; relax them. It might be easier to have someone else gently start swinging your arm.  As pain decreases, increase your swing. To start, arm swing should begin at 15 degree angles. In time and as pain lessens, move to 30-45 degree angles. Start with swinging for about 15 seconds, and work towards swinging for 3 to 5 minutes.  This exercise may also be performed in a standing/bent over position.  Stand and hold onto a sturdy chair with your good arm. Bend forward at the waist and bend your knees slightly to help protect your back. Relax your weak arm, let it hang limp. Relax your shoulder blade and let it drop.  Keep your shoulder relaxed and use body motion to swing your arm in small circles.  Stand up tall and relax.  Repeat motion and change direction of circles.  Start with swinging for about 30 seconds, and work towards swinging for 3 to 5 minutes. STRETCHING EXERCISES:  Lift your arm out in front of you with the elbow bent at 90 degrees. Using your other arm gently pull the elbow forward and across your body.  Bend one arm behind you with the palm facing outward. Using the other arm, hold a towel or rope and reach this arm up above your head, then bend it at the elbow to move your wrist to behind your neck. Grab the free end of the towel with the hand behind  your back. Gently pull the towel up with the hand behind your neck, gradually increasing the pull on the hand behind the small of your back. Then, gradually pull down with the hand behind the small of your back. This will pull the hand and arm behind your neck further. Both shoulders will have an increased range of motion with repetition of this exercise. STRENGTHENING EXERCISES:  Standing with your arm at your side and straight out from your shoulder with the elbow bent at 90 degrees, hold onto a small weight and slowly raise your hand so it points straight up in the air. Repeat this five times to begin with, and gradually increase to ten times. Do this four times per day. As you grow stronger you can gradually increase the weight.  Repeat the above exercise, only this time using an elastic band. Start with your hand up in the air and pull down until your  hand is by your side. As you grow stronger, gradually increase the amount you pull by increasing the number or size of the elastic bands. Use the same amount of repetitions.  Standing with your hand at your side and holding onto a weight, gradually lift the hand in front of you until it is over your head. Do the same also with the hand remaining at your side and lift the hand away from your body until it is again over your head. Repeat this five times to begin with, and gradually increase to ten times. Do this four times per day. As you grow stronger you can gradually increase the weight. Document Released: 08/29/2003 Document Revised: 12/05/2013 Document Reviewed: 11/30/2005 [facility] Patient Information 2015 [place], [place]. This information is not intended to replace advice given to you by your health care provider. Make sure you discuss any questions you have with your health care provider.  Heat Therapy Heat therapy can help make painful, stiff muscles and joints feel better. Do not use heat on new injuries. Wait at least 48 hours after an injury  to use heat. Do not use heat when you have aches or pains right after an activity. If you still have pain 3 hours after stopping the activity, then you may use heat. HOME CARE Wet heat pack  Soak a clean towel in warm water. Squeeze out the extra water.  Put the warm, wet towel in a plastic bag.  Place a thin, dry towel between your skin and the bag.  Put the heat pack on the area for 5 minutes, and check your skin. Your skin may be pink, but it should not be red.  Leave the heat pack on the area for 15 to 30 minutes.  Repeat this every 2 to 4 hours while awake. Do not use heat while you are sleeping. Warm water bath  Fill a tub with warm water.  Place the affected body part in the tub.  Soak the area for 20 to 40 minutes.  Repeat as needed. Hot water bottle  Fill the water bottle half full with hot water.  Press out the extra air. Close the cap tightly.  Place a dry towel between your skin and the bottle.  Put the bottle on the area for 5 minutes, and check your skin. Your skin may be pink, but it should not be red.  Leave the bottle on the area for 15 to 30 minutes.  Repeat this every 2 to 4 hours while awake. Electric heating pad  Place a dry towel between your skin and the heating pad.  Set the heating pad on low heat.  Put the heating pad on the area for 10 minutes, and check your skin. Your skin may be pink, but it should not be red.  Leave the heating pad on the area for 20 to 40 minutes.  Repeat this every 2 to 4 hours while awake.  Do not lie on the heating pad.  Do not fall asleep while using the heating pad.  Do not use the heating pad near water. GET HELP RIGHT AWAY IF:  You get blisters or red skin.  Your skin is puffy (swollen), or you lose feeling (numbness) in the affected area.  You have any new problems.  Your problems are getting worse.  You have any questions or concerns. If you have any problems, stop using heat therapy until you  see your doctor. MAKE SURE YOU:  Understand these instructions.  Will  watch your condition.  Will get help right away if you are not doing well or get worse. Document Released: 02/22/2012 Document Reviewed: 01/23/2014 [facility] Patient Information 2015 [place], [place]. This information is not intended to replace advice given to you by your health care provider. Make sure you discuss any questions you have with your health care provider.

## 2015-08-26 NOTE — ED Notes (Signed)
Pt states that she also fell down some steps 2 months ago and the can could be from that but she wants to make sure its not another MI

## 2015-08-26 NOTE — ED Notes (Signed)
PT states that she is having left shoulder pain, pt stats that the pain states in the left side of her neck and readaites down her left arm. Pt states that she had a MI in march. Pt states that part of the symptoms are similar to her previous MI.  Pt states that she has had productive cough for a month. Pt was ambulatory to treatment and in NAD at this time.

## 2015-09-24 ENCOUNTER — Emergency Department
Admission: EM | Admit: 2015-09-24 | Discharge: 2015-09-24 | Disposition: A | Discharge status: Home / Self Care | Payer: Medicaid Other | Attending: Emergency Medicine | Admitting: Emergency Medicine

## 2015-09-24 ENCOUNTER — Emergency Department: Payer: Medicaid Other

## 2015-09-24 ENCOUNTER — Encounter: Payer: Self-pay | Admitting: Emergency Medicine

## 2015-09-24 DIAGNOSIS — Z880 Allergy status to penicillin: Secondary | ICD-10-CM | POA: Insufficient documentation

## 2015-09-24 DIAGNOSIS — N390 Urinary tract infection, site not specified: Secondary | ICD-10-CM | POA: Insufficient documentation

## 2015-09-24 DIAGNOSIS — I10 Essential (primary) hypertension: Secondary | ICD-10-CM | POA: Insufficient documentation

## 2015-09-24 DIAGNOSIS — Z720 Tobacco use: Secondary | ICD-10-CM | POA: Diagnosis not present

## 2015-09-24 DIAGNOSIS — R1030 Lower abdominal pain, unspecified: Secondary | ICD-10-CM | POA: Diagnosis present

## 2015-09-24 DIAGNOSIS — Z791 Long term (current) use of non-steroidal anti-inflammatories (NSAID): Secondary | ICD-10-CM | POA: Diagnosis not present

## 2015-09-24 DIAGNOSIS — Z79899 Other long term (current) drug therapy: Secondary | ICD-10-CM | POA: Diagnosis not present

## 2015-09-24 DIAGNOSIS — R109 Unspecified abdominal pain: Secondary | ICD-10-CM

## 2015-09-24 LAB — CBC WITH DIFFERENTIAL/PLATELET
BASOS ABS: 0.1 10*3/uL (ref 0–0.1)
BASOS PCT: 1 %
Eosinophils Absolute: 0.3 10*3/uL (ref 0–0.7)
Eosinophils Relative: 2 %
HEMATOCRIT: 49.1 % — AB (ref 35.0–47.0)
HEMOGLOBIN: 16.6 g/dL — AB (ref 12.0–16.0)
Lymphocytes Relative: 14 %
Lymphs Abs: 1.8 10*3/uL (ref 1.0–3.6)
MCH: 31.3 pg (ref 26.0–34.0)
MCHC: 33.8 g/dL (ref 32.0–36.0)
MCV: 92.6 fL (ref 80.0–100.0)
MONOS PCT: 9 %
Monocytes Absolute: 1.2 10*3/uL — ABNORMAL HIGH (ref 0.2–0.9)
NEUTROS ABS: 9.5 10*3/uL — AB (ref 1.4–6.5)
NEUTROS PCT: 74 %
Platelets: 220 10*3/uL (ref 150–440)
RBC: 5.3 MIL/uL — AB (ref 3.80–5.20)
RDW: 12.7 % (ref 11.5–14.5)
WBC: 12.8 10*3/uL — AB (ref 3.6–11.0)

## 2015-09-24 LAB — URINALYSIS COMPLETE WITH MICROSCOPIC (ARMC ONLY)
BILIRUBIN URINE: NEGATIVE
Glucose, UA: NEGATIVE mg/dL
KETONES UR: NEGATIVE mg/dL
Nitrite: NEGATIVE
PH: 7 (ref 5.0–8.0)
PROTEIN: NEGATIVE mg/dL
Specific Gravity, Urine: 1.002 — ABNORMAL LOW (ref 1.005–1.030)

## 2015-09-24 LAB — COMPREHENSIVE METABOLIC PANEL
ALBUMIN: 4.6 g/dL (ref 3.5–5.0)
ALK PHOS: 107 U/L (ref 38–126)
ALT: 25 U/L (ref 14–54)
AST: 35 U/L (ref 15–41)
Anion gap: 8 (ref 5–15)
BILIRUBIN TOTAL: 0.9 mg/dL (ref 0.3–1.2)
BUN: 11 mg/dL (ref 6–20)
CALCIUM: 9.7 mg/dL (ref 8.9–10.3)
CO2: 27 mmol/L (ref 22–32)
Chloride: 104 mmol/L (ref 101–111)
Creatinine, Ser: 0.99 mg/dL (ref 0.44–1.00)
GFR calc Af Amer: >60 mL/min (ref >60)
GFR calc non Af Amer: >60 mL/min (ref >60)
GLUCOSE: 106 mg/dL — AB (ref 65–99)
Potassium: 3.5 mmol/L (ref 3.5–5.1)
Sodium: 139 mmol/L (ref 135–145)
TOTAL PROTEIN: 8.1 g/dL (ref 6.5–8.1)

## 2015-09-24 MED ORDER — PHENAZOPYRIDINE HCL 200 MG PO TABS: 200.0000 mg | ORAL_TABLET | Freq: Three times a day (TID) | ORAL | Status: DC | PRN

## 2015-09-24 MED ORDER — SODIUM CHLORIDE 0.9 % IV BOLUS (SEPSIS)
1000.0000 mL | Freq: Once | INTRAVENOUS | Status: AC
  Administered 2015-09-24: 1000 mL via INTRAVENOUS

## 2015-09-24 MED ORDER — DEXTROSE 5 % IV SOLN
1.0000 g | Freq: Once | INTRAVENOUS | Status: AC
  Administered 2015-09-24: 1 g via INTRAVENOUS
  Filled 2015-09-24: qty 10

## 2015-09-24 MED ORDER — CEPHALEXIN 500 MG PO CAPS: 500.0000 mg | ORAL_CAPSULE | Freq: Three times a day (TID) | ORAL | Status: DC

## 2015-09-24 NOTE — ED Provider Notes (Signed)
[facility] Emergency Department Provider Note   ____________________________________________  Time seen: 1420  I have reviewed the triage vital signs and the nursing notes.   HISTORY  Chief Complaint Abdominal Pain and urinary symptoms    History limited by: Not Limited   HPI GERTHA LICHTENBERG is a 39 y.o. female who presents to the emergency department today because of 1 week of lower abdominal pain and hematuria. The patient states that the pain has been constant. It is located in the suprapubic region. She has had associated dysuria and foul odor to her urine. She denies having any fevers although she has had some chills. She states she has a history of urinary tract infection and kidney stones.     Past Medical History  Diagnosis Date  . CHF (congestive heart failure) (HCC)   . Coronary artery disease   . Hypertension   . Seizures (HCC)     There are no active problems to display for this patient.   Past Surgical History  Procedure Laterality Date  . Abdominal hysterectomy    . Cesarean section      Current Outpatient Rx  Name  Route  Sig  Dispense  Refill  . estradiol (ESTRACE) 0.5 MG tablet   Oral   Take 0.5 mg by mouth at bedtime.           . medroxyPROGESTERone (PROVERA) 2.5 MG tablet   Oral   Take 2.5 mg by mouth at bedtime.           . naproxen (NAPROSYN) 500 MG tablet   Oral   Take 500 mg by mouth 2 (two) times daily with a meal.             Allergies Amoxicillin; Penicillins; and Sulfa antibiotics  No family history on file.  Social History Social History  Substance Use Topics  . Smoking status: Current Every Day Smoker  . Smokeless tobacco: None  . Alcohol Use: No    Review of Systems  Constitutional: Negative for fever. Cardiovascular: Negative for chest pain. Respiratory: Negative for shortness of breath. Gastrointestinal: Positive for lower abdominal pain. Genitourinary: Positive for  dysuria. Musculoskeletal: Negative for back pain. Skin: Negative for rash. Neurological: Negative for headaches, focal weakness or numbness.  10-point ROS otherwise negative.  ____________________________________________   PHYSICAL EXAM:  VITAL SIGNS: ED Triage Vitals  Enc Vitals Group     BP 09/24/15 1406 141/120 mmHg     Pulse Rate 09/24/15 1406 119     Resp 09/24/15 1406 18     Temp 09/24/15 1406 98.4 F (36.9 C)     Temp Source 09/24/15 1406 Oral     SpO2 09/24/15 1406 96 %     Weight 09/24/15 1406 125 lb (56.7 kg)     Height 09/24/15 1406  (1.676 m)     Head Cir --      Peak Flow --      Pain Score 09/24/15 1406 10   Constitutional: Alert and oriented. Well appearing and in no distress. Eyes: Conjunctivae are normal. PERRL. Normal extraocular movements. ENT   Head: Normocephalic and atraumatic.   Nose: No congestion/rhinnorhea.   Mouth/Throat: Mucous membranes are moist.   Neck: No stridor. Hematological/Lymphatic/Immunilogical: No cervical lymphadenopathy. Cardiovascular: Normal rate, regular rhythm.  No murmurs, rubs, or gallops. Respiratory: Normal respiratory effort without tachypnea nor retractions. Breath sounds are clear and equal bilaterally. No wheezes/rales/rhonchi. Gastrointestinal: Soft and minimally tender to palpation in the suprapubic region. Genitourinary:  Deferred Musculoskeletal: Normal range of motion in all extremities. No joint effusions.  No lower extremity tenderness nor edema. Neurologic:  Normal speech and language. No gross focal neurologic deficits are appreciated. Speech is normal.  Skin:  Skin is warm, dry and intact. No rash noted. Psychiatric: Mood and affect are normal. Speech and behavior are normal. Patient exhibits appropriate insight and judgment.  ____________________________________________    LABS (pertinent positives/negatives)  Labs Reviewed  CBC WITH DIFFERENTIAL/PLATELET - Abnormal; Notable for the  following:    WBC 12.8 (*)    RBC 5.30 (*)    Hemoglobin 16.6 (*)    HCT 49.1 (*)    Neutro Abs 9.5 (*)    Monocytes Absolute 1.2 (*)    All other components within normal limits  COMPREHENSIVE METABOLIC PANEL - Abnormal; Notable for the following:    Glucose, Bld 106 (*)    All other components within normal limits  URINALYSIS COMPLETEWITH MICROSCOPIC ([facility] ONLY) - Abnormal; Notable for the following:    Color, Urine STRAW (*)    APPearance CLEAR (*)    Specific Gravity, Urine 1.002 (*)    Hgb urine dipstick 3+ (*)    Leukocytes, UA 3+ (*)    Bacteria, UA RARE (*)    Squamous Epithelial / LPF 0-5 (*)    All other components within normal limits     ____________________________________________   EKG  None  ____________________________________________    RADIOLOGY  CT Renal Study  IMPRESSION: 1. Abundant stool noted in right colon and transverse colon. Mild focal thickening of colonic wall in inferior aspect of the cecum axial image 53 and sagittal image 33. This is suspicious for mild focal colitis. Clinical correlation is necessary. 2. Normal appendix. 3. No small bowel obstruction. 4. No nephrolithiasis. No hydronephrosis or hydroureter. No calcified ureteral calculi. 5. No calcified calculi are noted within urinary bladder. Status post hysterectomy.   ____________________________________________   PROCEDURES  Procedure(s) performed: None  Critical Care performed: No  ____________________________________________   INITIAL IMPRESSION / ASSESSMENT AND PLAN / ED COURSE  Pertinent labs & imaging results that were available during my care of the patient were reviewed by me and considered in my medical decision making (see chart for details).  Patient presents to the emergency department with lower abdominal pain and painful urination. Urine was consistent with a urinary tract infection. I did obtain a CT renal study to make sure that the patient did  not have an infected stone. No evidence of a stone however did show some possible colitis. I think likely howeverpatient's discomfort and symptoms are result of a urinary tract infection. Will discharge home with antibiotics and Pyridium.  ____________________________________________   FINAL CLINICAL IMPRESSION(S) / ED DIAGNOSES  Final diagnoses:  Abdominal pain  UTI (lower urinary tract infection)     Phineas Semen, MD 09/24/15 1815

## 2015-09-24 NOTE — ED Notes (Signed)
Pt states she has had lower abd. Pain, painful urination x 1 week, states she now is passing bld clots through her urine, hx of multiple kidney infections

## 2015-09-24 NOTE — Discharge Instructions (Signed)
Please seek medical attention for any high fevers, chest pain, shortness of breath, change in behavior, persistent vomiting, bloody stool or any other new or concerning symptoms. ° ° °Urinary Tract Infection °Urinary tract infections (UTIs) can develop anywhere along your urinary tract. Your urinary tract is your body's drainage system for removing wastes and extra water. Your urinary tract includes two kidneys, two ureters, a bladder, and a urethra. Your kidneys are a pair of bean-shaped organs. Each kidney is about the size of your fist. They are located below your ribs, one on each side of your spine. °CAUSES °Infections are caused by microbes, which are microscopic organisms, including fungi, viruses, and bacteria. These organisms are so small that they can only be seen through a microscope. Bacteria are the microbes that most commonly cause UTIs. °SYMPTOMS  °Symptoms of UTIs may vary by age and gender of the patient and by the location of the infection. Symptoms in young women typically include a frequent and intense urge to urinate and a painful, burning feeling in the bladder or urethra during urination. Older women and men are more likely to be tired, shaky, and weak and have muscle aches and abdominal pain. A fever may mean the infection is in your kidneys. Other symptoms of a kidney infection include pain in your back or sides below the ribs, nausea, and vomiting. °DIAGNOSIS °To diagnose a UTI, your caregiver will ask you about your symptoms. Your caregiver will also ask you to provide a urine sample. The urine sample will be tested for bacteria and white blood cells. White blood cells are made by your body to help fight infection. °TREATMENT  °Typically, UTIs can be treated with medication. Because most UTIs are caused by a bacterial infection, they usually can be treated with the use of antibiotics. The choice of antibiotic and length of treatment depend on your symptoms and the type of bacteria causing  your infection. °HOME CARE INSTRUCTIONS °· If you were prescribed antibiotics, take them exactly as your caregiver instructs you. Finish the medication even if you feel better after you have only taken some of the medication. °· Drink enough water and fluids to keep your urine clear or pale yellow. °· Avoid caffeine, tea, and carbonated beverages. They tend to irritate your bladder. °· Empty your bladder often. Avoid holding urine for long periods of time. °· Empty your bladder before and after sexual intercourse. °· After a bowel movement, women should cleanse from front to back. Use each tissue only once. °SEEK MEDICAL CARE IF:  °· You have back pain. °· You develop a fever. °· Your symptoms do not begin to resolve within 3 days. °SEEK IMMEDIATE MEDICAL CARE IF:  °· You have severe back pain or lower abdominal pain. °· You develop chills. °· You have nausea or vomiting. °· You have continued burning or discomfort with urination. °MAKE SURE YOU:  °· Understand these instructions. °· Will watch your condition. °· Will get help right away if you are not doing well or get worse. °  °This information is not intended to replace advice given to you by your health care provider. Make sure you discuss any questions you have with your health care provider. °  °Document Released: 09/09/2005 Document Revised: 08/21/2015 Document Reviewed: 01/08/2012 °Elsevier Interactive Patient Education ©2016 Elsevier Inc. ° °

## 2016-02-27 ENCOUNTER — Encounter: Payer: Self-pay | Admitting: Emergency Medicine

## 2016-02-27 ENCOUNTER — Emergency Department
Admission: EM | Admit: 2016-02-27 | Discharge: 2016-02-27 | Disposition: A | Discharge status: Home / Self Care | Payer: Medicaid Other | Attending: Emergency Medicine | Admitting: Emergency Medicine

## 2016-02-27 DIAGNOSIS — I10 Essential (primary) hypertension: Secondary | ICD-10-CM | POA: Diagnosis not present

## 2016-02-27 DIAGNOSIS — Z79899 Other long term (current) drug therapy: Secondary | ICD-10-CM | POA: Diagnosis not present

## 2016-02-27 DIAGNOSIS — R300 Dysuria: Secondary | ICD-10-CM | POA: Diagnosis not present

## 2016-02-27 DIAGNOSIS — K0381 Cracked tooth: Secondary | ICD-10-CM | POA: Insufficient documentation

## 2016-02-27 DIAGNOSIS — Z880 Allergy status to penicillin: Secondary | ICD-10-CM | POA: Diagnosis not present

## 2016-02-27 DIAGNOSIS — Z792 Long term (current) use of antibiotics: Secondary | ICD-10-CM | POA: Insufficient documentation

## 2016-02-27 DIAGNOSIS — Z791 Long term (current) use of non-steroidal anti-inflammatories (NSAID): Secondary | ICD-10-CM | POA: Insufficient documentation

## 2016-02-27 DIAGNOSIS — K0889 Other specified disorders of teeth and supporting structures: Secondary | ICD-10-CM | POA: Diagnosis not present

## 2016-02-27 DIAGNOSIS — K029 Dental caries, unspecified: Secondary | ICD-10-CM | POA: Diagnosis not present

## 2016-02-27 DIAGNOSIS — F17200 Nicotine dependence, unspecified, uncomplicated: Secondary | ICD-10-CM | POA: Diagnosis not present

## 2016-02-27 MED ORDER — OXYCODONE-ACETAMINOPHEN 5-325 MG PO TABS: 1.0000 | ORAL_TABLET | Freq: Four times a day (QID) | ORAL | Status: DC | PRN

## 2016-02-27 MED ORDER — CLINDAMYCIN HCL 300 MG PO CAPS: 300.0000 mg | ORAL_CAPSULE | Freq: Three times a day (TID) | ORAL | Status: DC

## 2016-02-27 MED ORDER — IBUPROFEN 800 MG PO TABS: 800.0000 mg | ORAL_TABLET | Freq: Three times a day (TID) | ORAL | Status: DC | PRN

## 2016-02-27 NOTE — ED Provider Notes (Signed)
[facility] Emergency Department Provider Note  ____________________________________________  Time seen: Approximately 5:34 PM  I have reviewed the triage vital signs and the nursing notes.   HISTORY  Chief Complaint Dental Pain    HPI SHAMAR KRACKE is a 40 y.o. female respiratory 2 days of worsening rectal pain and left upper dentition. History of dental caries. Has a dentist Dr. Mardella Layman who she plans to see in a few weeks for dental extraction. She denies any fevers or chills. Pain with chewing. Throbbing pain especially at night.She has also been recently diagnosed with UTI and is currently taking Keflex. However her dental pain developed while on this antibiotic.   Past Medical History  Diagnosis Date  . CHF (congestive heart failure) (HCC)   . Coronary artery disease   . Hypertension   . Seizures (HCC)     There are no active problems to display for this patient.   Past Surgical History  Procedure Laterality Date  . Abdominal hysterectomy    . Cesarean section      Current Outpatient Rx  Name  Route  Sig  Dispense  Refill  . cephALEXin (KEFLEX) 500 MG capsule   Oral   Take 1 capsule (500 mg total) by mouth 3 (three) times daily.   30 capsule   0   . clindamycin (CLEOCIN) 300 MG capsule   Oral   Take 1 capsule (300 mg total) by mouth 3 (three) times daily.   30 capsule   0   . estradiol (ESTRACE) 0.5 MG tablet   Oral   Take 0.5 mg by mouth at bedtime.           Marland Kitchen ibuprofen (ADVIL,MOTRIN) 800 MG tablet   Oral   Take 1 tablet (800 mg total) by mouth every 8 (eight) hours as needed.   15 tablet   0   . medroxyPROGESTERone (PROVERA) 2.5 MG tablet   Oral   Take 2.5 mg by mouth at bedtime.           . naproxen (NAPROSYN) 500 MG tablet   Oral   Take 500 mg by mouth 2 (two) times daily with a meal.           . oxyCODONE-acetaminophen (ROXICET) 5-325 MG tablet   Oral   Take 1 tablet by mouth every 6 (six) hours as  needed.   20 tablet   0   . phenazopyridine (PYRIDIUM) 200 MG tablet   Oral   Take 1 tablet (200 mg total) by mouth 3 (three) times daily as needed for pain.   20 tablet   0     Allergies Amoxicillin; Penicillins; and Sulfa antibiotics  No family history on file.  Social History Social History  Substance Use Topics  . Smoking status: Current Every Day Smoker  . Smokeless tobacco: None  . Alcohol Use: No    Review of Systems Constitutional: No fever/chills Eyes: No visual changes. ENT: No sore throat. Cardiovascular: Denies chest pain. Respiratory: Denies shortness of breath. Gastrointestinal: No abdominal pain.  No nausea, no vomiting.  No diarrhea.  No constipation. Genitourinary:Recently had dysuria and he is currently taking Keflex for a UTI Musculoskeletal: Negative for back pain. Skin: Negative for rash. Neurological: Negative for headaches, focal weakness or numbness. 10-point ROS otherwise negative.  ____________________________________________   PHYSICAL EXAM:  VITAL SIGNS: ED Triage Vitals  Enc Vitals Group     BP 02/27/16 1552 138/69 mmHg     Pulse Rate 02/27/16 1552  84     Resp 02/27/16 1552 18     Temp 02/27/16 1552 97.9 F (36.6 C)     Temp Source 02/27/16 1552 Oral     SpO2 02/27/16 1552 100 %     Weight 02/27/16 1552 130 lb (58.968 kg)     Height 02/27/16 1552 5\' 6"  (1.676 m)     Head Cir --      Peak Flow --      Pain Score 02/27/16 1552 9     Pain Loc --      Pain Edu? --      Excl. in GC? --     Constitutional: Alert and oriented. Well appearing and in no acute distress. Eyes: Conjunctivae are normal. PERRL. EOMI. Ears:  Clear with normal landmarks. No erythema. Head: Atraumatic. Nose: No congestion/rhinnorhea. Mouth/Throat: Mucous membranes are moist.  Oropharynx non-erythematous. No lesions.Dental caries left upper dentition with fractured tooth and tenderness around the gums with erythema Neck:  Supple.  No adenopathy.    Cardiovascular: Normal rate, regular rhythm. Grossly normal heart sounds.  Good peripheral circulation. Respiratory: Normal respiratory effort.  No retractions. Lungs CTAB. Musculoskeletal: Nml ROM of upper and lower extremity joints. Neurologic:  Normal speech and language. No gross focal neurologic deficits are appreciated. No gait instability. Skin:  Skin is warm, dry and intact. No rash noted. Psychiatric: Mood and affect are normal. Speech and behavior are normal.  ____________________________________________   LABS (all labs ordered are listed, but only abnormal results are displayed)  Labs Reviewed - No data to display ____________________________________________  EKG   ____________________________________________  RADIOLOGY   ____________________________________________   PROCEDURES  Procedure(s) performed: None  Critical Care performed: No  ____________________________________________   INITIAL IMPRESSION / ASSESSMENT AND PLAN / ED COURSE  Pertinent labs & imaging results that were available during my care of the patient were reviewed by me and considered in my medical decision making (see chart for details).  40 year old female with poor dentition presents with 2-3 days of dental pain. No evidence of abscess on exam. She is placed on clindamycin and given Percocet for pain control. She may also take ibuprofen. She has a dentist Dr. Mardella LaymanLindsey who she will follow up with for dental extraction. ____________________________________________   FINAL CLINICAL IMPRESSION(S) / ED DIAGNOSES  Final diagnoses:  Pain due to dental caries      Ignacia BayleyRobert Kiara Keep, PA-C 02/27/16 1738  Myrna Blazeravid Matthew Schaevitz, MD 02/28/16 [phone]

## 2016-02-27 NOTE — Discharge Instructions (Signed)
Dental Caries Dental caries is tooth decay. This decay can cause a hole in teeth (cavity) that can get bigger and deeper over time. HOME CARE  Brush and floss your teeth. Do this at least two times a day.  Use a fluoride toothpaste.  Use a mouth rinse if told by your dentist or doctor.  Eat less sugary and starchy foods. Drink less sugary drinks.  Avoid snacking often on sugary and starchy foods. Avoid sipping often on sugary drinks.  Keep regular checkups and cleanings with your dentist.  Use fluoride supplements if told by your dentist or doctor.  Allow fluoride to be applied to teeth if told by your dentist or doctor.   This information is not intended to replace advice given to you by your health care provider. Make sure you discuss any questions you have with your health care provider.   Document Released: 09/08/2008 Document Revised: 12/21/2014 Document Reviewed: 12/02/2012 Elsevier Interactive Patient Education 2016 [employer]   Take advice and pain medicine as directed. Follow-up with your dentist as soon as you're able.

## 2016-02-27 NOTE — ED Notes (Signed)
C/o dental pain-- upper left jaw.  Patient has dental abstraction planned for the end of this month.

## 2016-03-25 IMAGING — CT CT RENAL STONE PROTOCOL
1 of 2 series · 15 of 32 positions shown, 19 images · non-contrast
Comparison: 02/21/2009

CLINICAL DATA: Right lower quadrant pain, right flank pain for 1
week

EXAM:
CT ABDOMEN AND PELVIS WITHOUT CONTRAST
TECHNIQUE: Multidetector CT imaging of the abdomen and pelvis was performed
following the standard protocol without IV contrast.

[Series 2: stone standard full · axial · 0.64mm/px · z∈[-838,-438]mm · 15 of 88 slices shown, 19 images]
[im 4/88  soft-tissue]
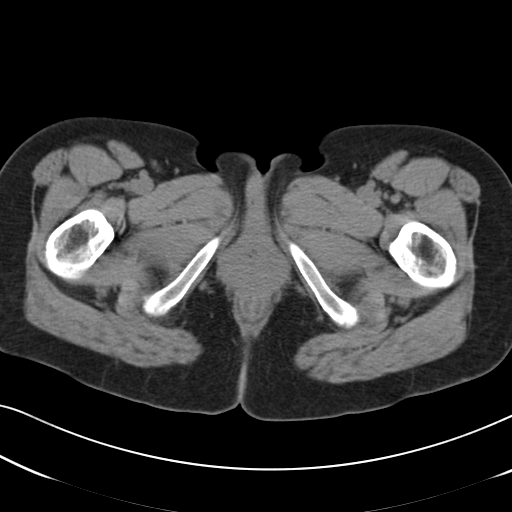
[im 4/88  bone]
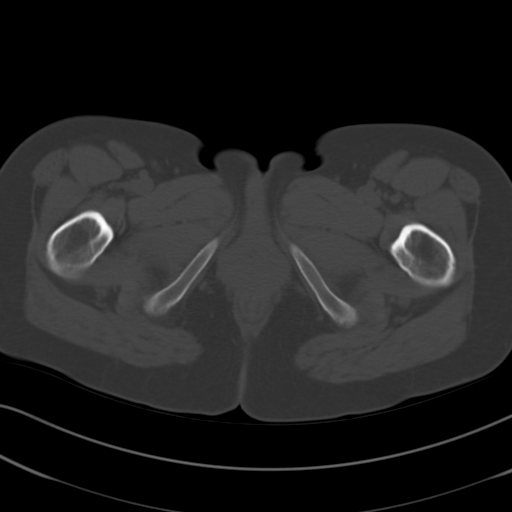
[im 11/88  soft-tissue]
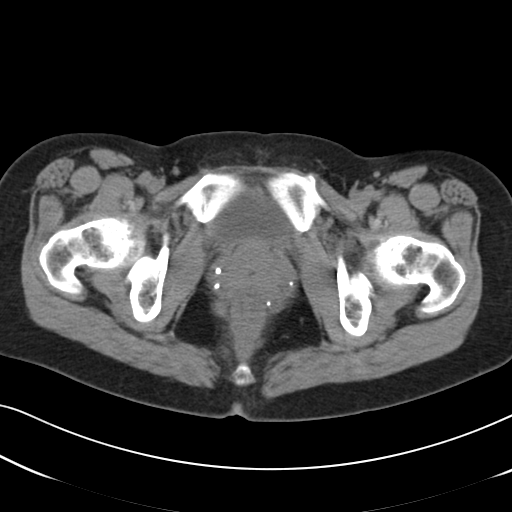
[im 17/88  soft-tissue]
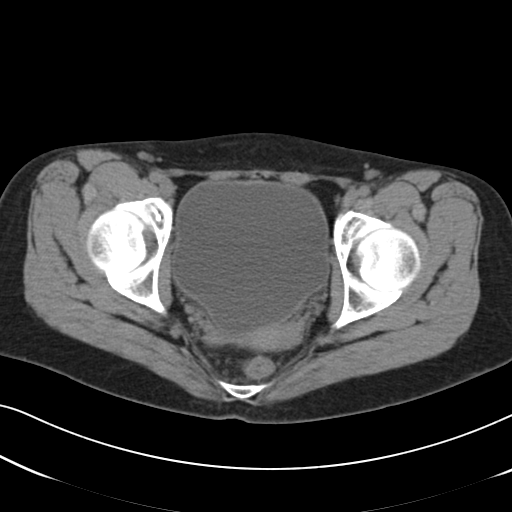
[im 24/88  soft-tissue]
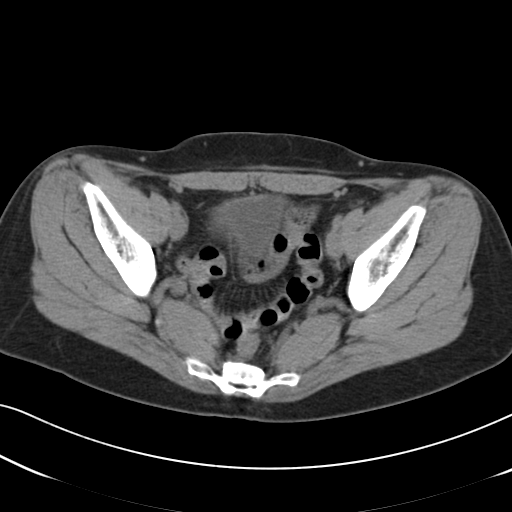
[im 31/88  soft-tissue]
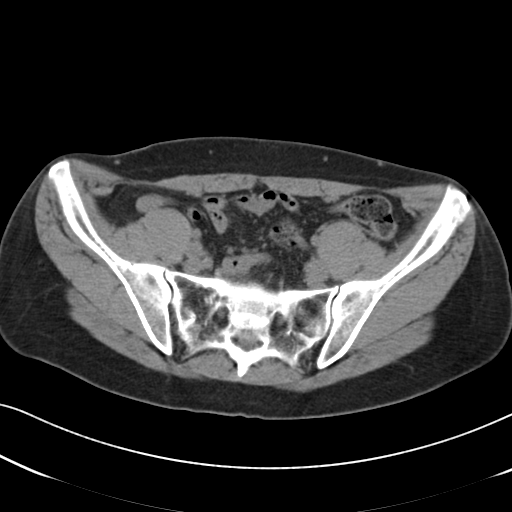
[im 37/88  soft-tissue]
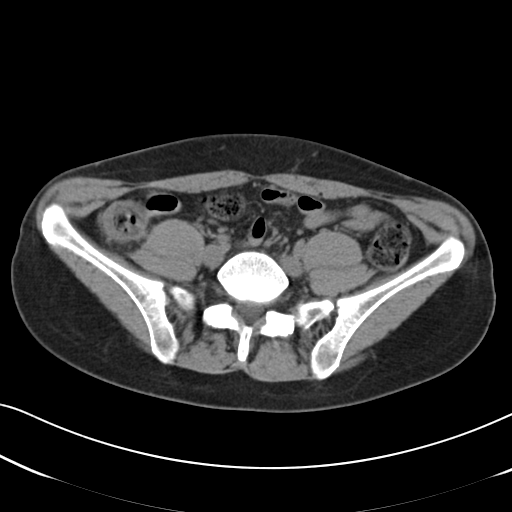
[im 44/88  soft-tissue]
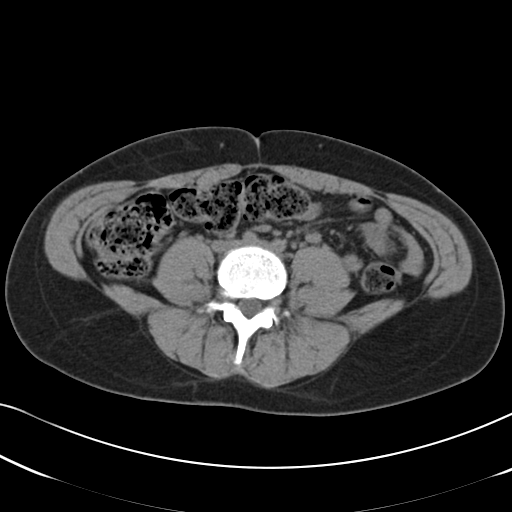
[im 51/88  soft-tissue]
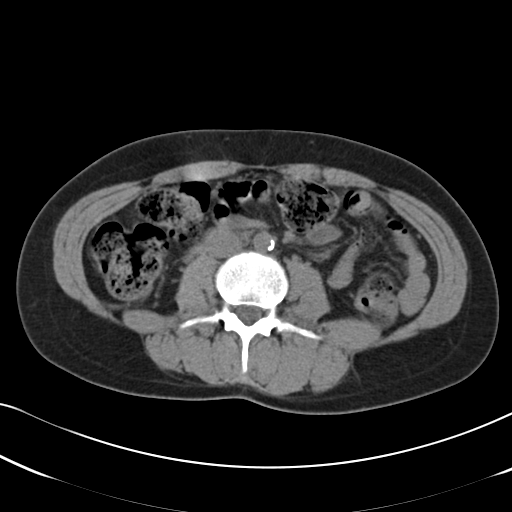
[im 57/88  soft-tissue]
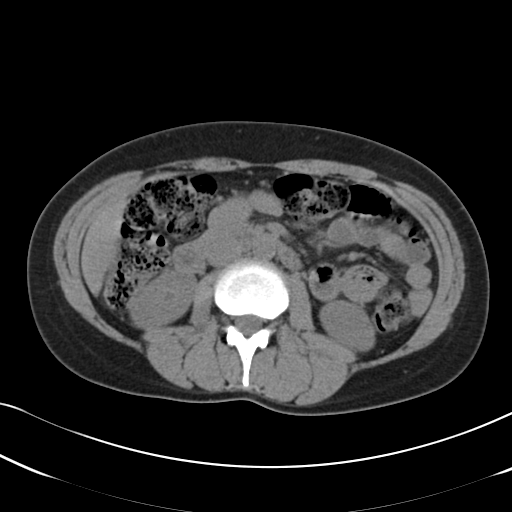
[im 57/88  bone]
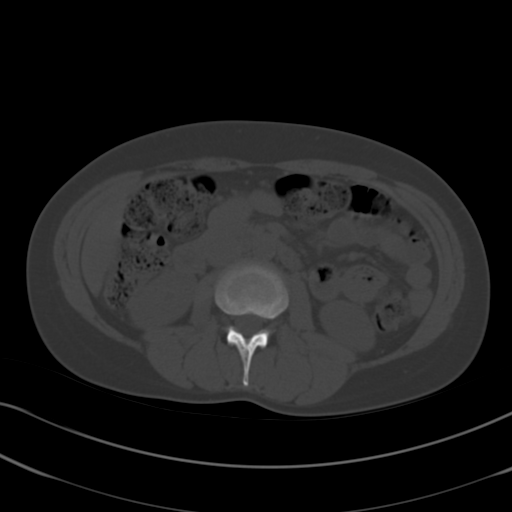
[im 64/88  soft-tissue]
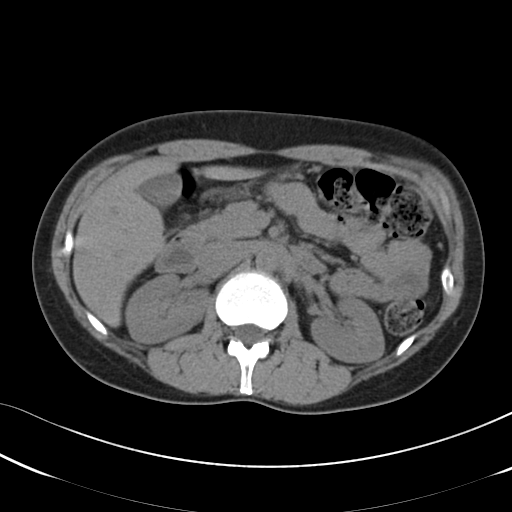
[im 71/88  soft-tissue]
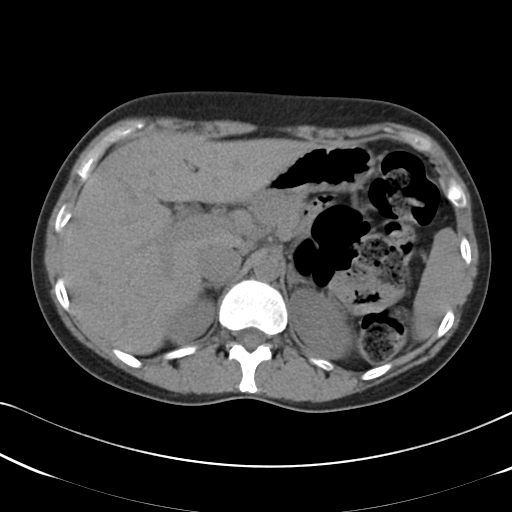
[im 74/88  lung]
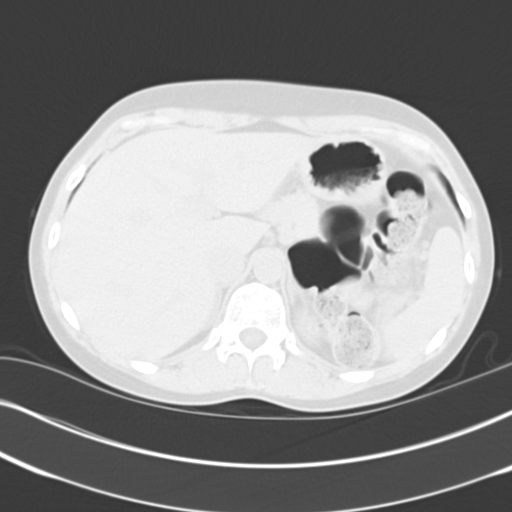
[im 77/88  soft-tissue]
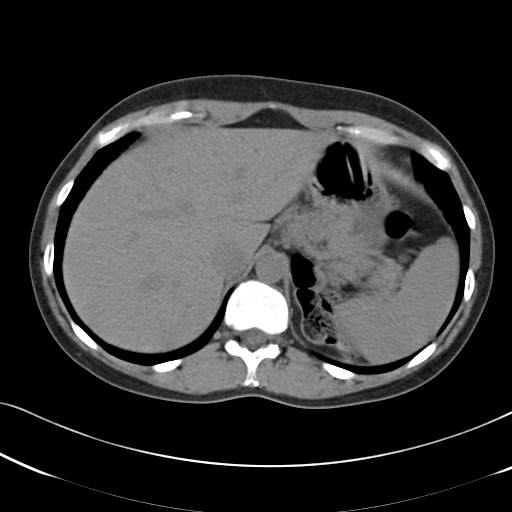
[im 77/88  lung]
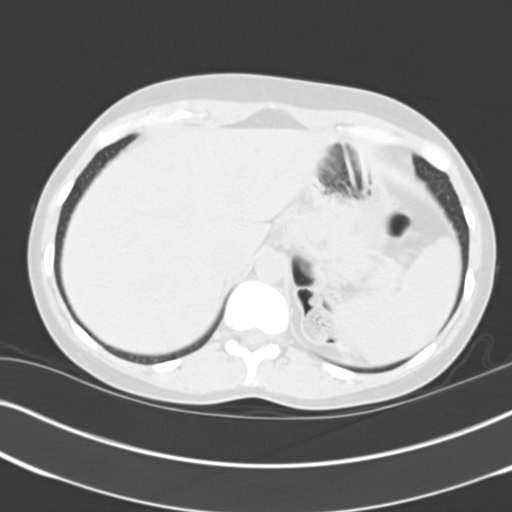
[im 81/88  lung]
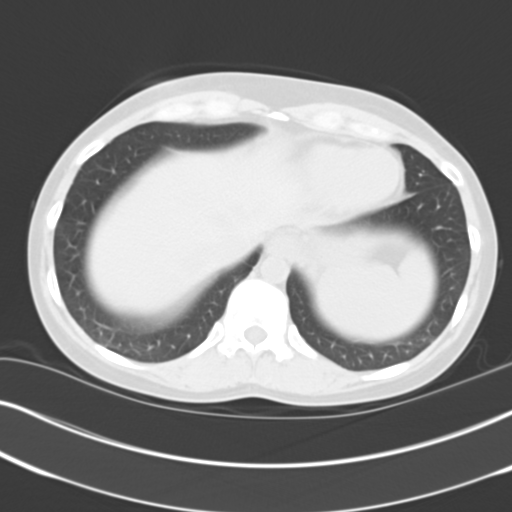
[im 84/88  soft-tissue]
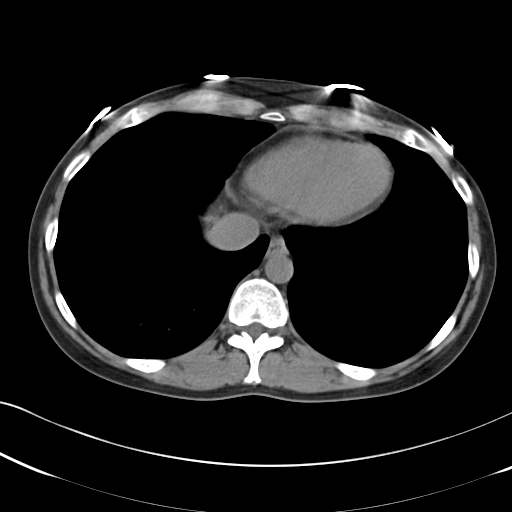
[im 84/88  lung]
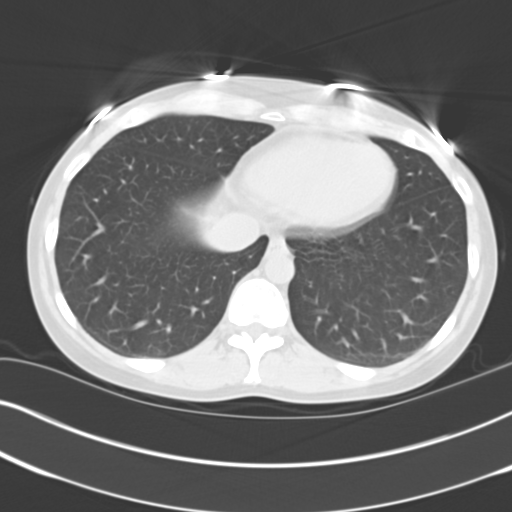

[15 of 32 positions shown; findings below may reference images not displayed]

FINDINGS: Lung bases are unremarkable. Sagittal images of the spine are
unremarkable.

Unenhanced liver shows no biliary ductal dilatation. No calcified
gallstones are noted within gallbladder. Unenhanced pancreas, spleen
and adrenal glands are unremarkable. Unenhanced kidneys are
symmetrical in size. No hydronephrosis or hydroureter.

No calcified ureteral calculi.

No small bowel obstruction.  No ascites or free air.  No adenopathy.

Abundant stool noted in right colon and transverse colon. Moderate
stool noted in descending colon. Normal appendix is noted in axial
image 56.

In axial image 53 there is mild thickening of cecal wall. This is
confirmed in sagittal image 33. This is suspicious for mild focal
colitis. There is no evidence of pericolonic abscess. Mild
distension of urinary bladder. The patient is status post
hysterectomy.
IMPRESSION: 1. Abundant stool noted in right colon and transverse colon. Mild
focal thickening of colonic wall in inferior aspect of the cecum
axial image 53 and sagittal image 33. This is suspicious for mild
focal colitis. Clinical correlation is necessary.
2. Normal appendix.
3. No small bowel obstruction.
4. No nephrolithiasis. No hydronephrosis or hydroureter. No
calcified ureteral calculi.
5. No calcified calculi are noted within urinary bladder. Status
post hysterectomy.

## 2016-09-14 ENCOUNTER — Emergency Department
Admission: EM | Admit: 2016-09-14 | Discharge: 2016-09-15 | Disposition: A | Discharge status: Home / Self Care | Payer: Medicaid Other | Source: Home / Self Care

## 2016-09-14 ENCOUNTER — Emergency Department: Payer: Medicaid Other

## 2016-09-14 ENCOUNTER — Encounter: Payer: Self-pay | Admitting: *Deleted

## 2016-09-14 DIAGNOSIS — Z883 Allergy status to other anti-infective agents status: Secondary | ICD-10-CM | POA: Diagnosis not present

## 2016-09-14 DIAGNOSIS — I2510 Atherosclerotic heart disease of native coronary artery without angina pectoris: Secondary | ICD-10-CM

## 2016-09-14 DIAGNOSIS — F17200 Nicotine dependence, unspecified, uncomplicated: Secondary | ICD-10-CM

## 2016-09-14 DIAGNOSIS — Z882 Allergy status to sulfonamides status: Secondary | ICD-10-CM | POA: Diagnosis not present

## 2016-09-14 DIAGNOSIS — Z5321 Procedure and treatment not carried out due to patient leaving prior to being seen by health care provider: Secondary | ICD-10-CM | POA: Insufficient documentation

## 2016-09-14 DIAGNOSIS — Z79899 Other long term (current) drug therapy: Secondary | ICD-10-CM | POA: Insufficient documentation

## 2016-09-14 DIAGNOSIS — M25512 Pain in left shoulder: Secondary | ICD-10-CM | POA: Insufficient documentation

## 2016-09-14 DIAGNOSIS — E782 Mixed hyperlipidemia: Secondary | ICD-10-CM | POA: Diagnosis not present

## 2016-09-14 DIAGNOSIS — W109XXA Fall (on) (from) unspecified stairs and steps, initial encounter: Secondary | ICD-10-CM | POA: Insufficient documentation

## 2016-09-14 DIAGNOSIS — Z79891 Long term (current) use of opiate analgesic: Secondary | ICD-10-CM | POA: Insufficient documentation

## 2016-09-14 DIAGNOSIS — Z91010 Allergy to peanuts: Secondary | ICD-10-CM | POA: Diagnosis not present

## 2016-09-14 DIAGNOSIS — I509 Heart failure, unspecified: Secondary | ICD-10-CM

## 2016-09-14 DIAGNOSIS — Z90710 Acquired absence of both cervix and uterus: Secondary | ICD-10-CM | POA: Diagnosis not present

## 2016-09-14 DIAGNOSIS — I110 Hypertensive heart disease with heart failure: Secondary | ICD-10-CM | POA: Diagnosis not present

## 2016-09-14 DIAGNOSIS — Z880 Allergy status to penicillin: Secondary | ICD-10-CM | POA: Diagnosis not present

## 2016-09-14 DIAGNOSIS — Z791 Long term (current) use of non-steroidal anti-inflammatories (NSAID): Secondary | ICD-10-CM | POA: Insufficient documentation

## 2016-09-14 DIAGNOSIS — I5181 Takotsubo syndrome: Secondary | ICD-10-CM | POA: Insufficient documentation

## 2016-09-14 DIAGNOSIS — R079 Chest pain, unspecified: Secondary | ICD-10-CM | POA: Insufficient documentation

## 2016-09-14 DIAGNOSIS — I252 Old myocardial infarction: Secondary | ICD-10-CM | POA: Insufficient documentation

## 2016-09-14 DIAGNOSIS — R569 Unspecified convulsions: Secondary | ICD-10-CM | POA: Diagnosis not present

## 2016-09-14 LAB — CBC
HEMATOCRIT: 43.3 % (ref 35.0–47.0)
HEMOGLOBIN: 14.7 g/dL (ref 12.0–16.0)
MCH: 31.3 pg (ref 26.0–34.0)
MCHC: 33.8 g/dL (ref 32.0–36.0)
MCV: 92.4 fL (ref 80.0–100.0)
PLATELETS: 207 10*3/uL (ref 150–440)
RBC: 4.69 MIL/uL (ref 3.80–5.20)
RDW: 12.9 % (ref 11.5–14.5)
WBC: 8.2 10*3/uL (ref 3.6–11.0)

## 2016-09-14 LAB — BASIC METABOLIC PANEL
ANION GAP: 4 — AB (ref 5–15)
BUN: 10 mg/dL (ref 6–20)
CHLORIDE: 105 mmol/L (ref 101–111)
CO2: 31 mmol/L (ref 22–32)
Calcium: 9.2 mg/dL (ref 8.9–10.3)
Creatinine, Ser: 1.01 mg/dL — ABNORMAL HIGH (ref 0.44–1.00)
GFR calc non Af Amer: >60 mL/min (ref >60)
Glucose, Bld: 112 mg/dL — ABNORMAL HIGH (ref 65–99)
POTASSIUM: 3.7 mmol/L (ref 3.5–5.1)
SODIUM: 140 mmol/L (ref 135–145)

## 2016-09-14 LAB — TROPONIN I: Troponin I: <0.03 ng/mL (ref <0.03)

## 2016-09-14 NOTE — ED Triage Notes (Signed)
Pt has chest pain for 3 hours.  Pain radiates into left arm.  No n/v/d.  cig smoker .  No sob.  Pt alert.

## 2016-09-15 ENCOUNTER — Observation Stay
Admission: EM | Admit: 2016-09-15 | Discharge: 2016-09-16 | Disposition: A | Discharge status: Home / Self Care | Payer: Medicaid Other | Attending: Internal Medicine | Admitting: Internal Medicine

## 2016-09-15 ENCOUNTER — Emergency Department: Payer: Medicaid Other

## 2016-09-15 ENCOUNTER — Encounter: Payer: Self-pay | Admitting: Emergency Medicine

## 2016-09-15 DIAGNOSIS — R079 Chest pain, unspecified: Secondary | ICD-10-CM | POA: Diagnosis present

## 2016-09-15 LAB — CBC
HEMATOCRIT: 42.7 % (ref 35.0–47.0)
Hemoglobin: 14.9 g/dL (ref 12.0–16.0)
MCH: 31.5 pg (ref 26.0–34.0)
MCHC: 34.9 g/dL (ref 32.0–36.0)
MCV: 90.5 fL (ref 80.0–100.0)
Platelets: 192 10*3/uL (ref 150–440)
RBC: 4.72 MIL/uL (ref 3.80–5.20)
RDW: 12.6 % (ref 11.5–14.5)
WBC: 7.6 10*3/uL (ref 3.6–11.0)

## 2016-09-15 LAB — BASIC METABOLIC PANEL
Anion gap: 4 — ABNORMAL LOW (ref 5–15)
BUN: 9 mg/dL (ref 6–20)
CHLORIDE: 104 mmol/L (ref 101–111)
CO2: 31 mmol/L (ref 22–32)
Calcium: 9.3 mg/dL (ref 8.9–10.3)
Creatinine, Ser: 0.89 mg/dL (ref 0.44–1.00)
GFR calc Af Amer: >60 mL/min (ref >60)
GFR calc non Af Amer: >60 mL/min (ref >60)
GLUCOSE: 96 mg/dL (ref 65–99)
POTASSIUM: 3.4 mmol/L — AB (ref 3.5–5.1)
Sodium: 139 mmol/L (ref 135–145)

## 2016-09-15 LAB — TROPONIN I: Troponin I: <0.03 ng/mL (ref <0.03)

## 2016-09-15 LAB — BRAIN NATRIURETIC PEPTIDE: B Natriuretic Peptide: 31 pg/mL (ref 0.0–100.0)

## 2016-09-15 MED ORDER — GI COCKTAIL ~~LOC~~: 30.0000 mL | Freq: Four times a day (QID) | ORAL | Status: DC | PRN

## 2016-09-15 MED ORDER — ZOLPIDEM TARTRATE 5 MG PO TABS
5.0000 mg | ORAL_TABLET | Freq: Every evening | ORAL | Status: DC | PRN
  Administered 2016-09-15: 5 mg via ORAL
  Filled 2016-09-15: qty 1

## 2016-09-15 MED ORDER — SODIUM CHLORIDE 0.9 % IV SOLN
INTRAVENOUS | Status: DC
  Administered 2016-09-15 – 2016-09-16 (×2): via INTRAVENOUS

## 2016-09-15 MED ORDER — NITROGLYCERIN 0.4 MG SL SUBL: 0.4000 mg | SUBLINGUAL_TABLET | SUBLINGUAL | Status: DC | PRN

## 2016-09-15 MED ORDER — SODIUM CHLORIDE 0.9 % IV BOLUS (SEPSIS)
500.0000 mL | Freq: Once | INTRAVENOUS | Status: AC
  Administered 2016-09-15: 500 mL via INTRAVENOUS

## 2016-09-15 MED ORDER — ASPIRIN EC 81 MG PO TBEC
81.0000 mg | DELAYED_RELEASE_TABLET | Freq: Every day | ORAL | Status: DC
  Administered 2016-09-16: 81 mg via ORAL
  Filled 2016-09-15: qty 1

## 2016-09-15 MED ORDER — KETOROLAC TROMETHAMINE 30 MG/ML IJ SOLN
30.0000 mg | Freq: Four times a day (QID) | INTRAMUSCULAR | Status: DC | PRN
  Administered 2016-09-16 (×2): 30 mg via INTRAVENOUS
  Filled 2016-09-15 (×2): qty 1

## 2016-09-15 MED ORDER — METOPROLOL TARTRATE 25 MG PO TABS
25.0000 mg | ORAL_TABLET | Freq: Two times a day (BID) | ORAL | Status: DC
  Administered 2016-09-15 – 2016-09-16 (×2): 25 mg via ORAL
  Filled 2016-09-15 (×2): qty 1

## 2016-09-15 MED ORDER — MORPHINE SULFATE (PF) 2 MG/ML IV SOLN: 2.0000 mg | INTRAVENOUS | Status: DC | PRN

## 2016-09-15 MED ORDER — ONDANSETRON HCL 4 MG/2ML IJ SOLN: 4.0000 mg | Freq: Four times a day (QID) | INTRAMUSCULAR | Status: DC | PRN

## 2016-09-15 MED ORDER — ALPRAZOLAM 0.25 MG PO TABS: 0.2500 mg | ORAL_TABLET | Freq: Two times a day (BID) | ORAL | Status: DC | PRN

## 2016-09-15 MED ORDER — INFLUENZA VAC SPLIT QUAD 0.5 ML IM SUSY
0.5000 mL | PREFILLED_SYRINGE | INTRAMUSCULAR | Status: AC
  Administered 2016-09-16: 0.5 mL via INTRAMUSCULAR
  Filled 2016-09-15: qty 0.5

## 2016-09-15 MED ORDER — GABAPENTIN 300 MG PO CAPS
300.0000 mg | ORAL_CAPSULE | Freq: Three times a day (TID) | ORAL | Status: DC
  Administered 2016-09-15 – 2016-09-16 (×3): 300 mg via ORAL
  Filled 2016-09-15 (×3): qty 1

## 2016-09-15 MED ORDER — ACETAMINOPHEN 325 MG PO TABS: 650.0000 mg | ORAL_TABLET | ORAL | Status: DC | PRN

## 2016-09-15 MED ORDER — ASPIRIN 81 MG PO CHEW
324.0000 mg | CHEWABLE_TABLET | Freq: Once | ORAL | Status: AC
  Administered 2016-09-15: 324 mg via ORAL
  Filled 2016-09-15: qty 4

## 2016-09-15 MED ORDER — HEPARIN SODIUM (PORCINE) 5000 UNIT/ML IJ SOLN
5000.0000 [IU] | Freq: Three times a day (TID) | INTRAMUSCULAR | Status: DC
  Administered 2016-09-16 (×2): 5000 [IU] via SUBCUTANEOUS
  Filled 2016-09-15 (×2): qty 1

## 2016-09-15 MED ORDER — NITROGLYCERIN 0.4 MG SL SUBL
0.4000 mg | SUBLINGUAL_TABLET | SUBLINGUAL | Status: DC | PRN
  Administered 2016-09-15: 0.4 mg via SUBLINGUAL
  Filled 2016-09-15 (×2): qty 1

## 2016-09-15 MED ORDER — ATORVASTATIN CALCIUM 20 MG PO TABS
20.0000 mg | ORAL_TABLET | Freq: Every day | ORAL | Status: DC
  Administered 2016-09-15: 20 mg via ORAL
  Filled 2016-09-15: qty 1

## 2016-09-15 NOTE — H&P (Addendum)
[facility] [place] [facility] Admission History and Physical [employer] Abena Erdman, D.O.  ---------------------------------------------------------------------------------------------------------------------   PATIENT NAME: Tami LollCynthia Maney MR#: [id number] DATE OF BIRTH: [birth year] DATE OF ADMISSION: 09/15/2016 PRIMARY CARE PHYSICIAN: Pcp Not In System  REQUESTING/REFERRING PHYSICIAN: ED Dr. Sharma CovertNorman  CHIEF COMPLAINT: Chief Complaint  Patient presents with  . Neck Pain  . Shoulder Pain    HISTORY OF PRESENT ILLNESS: Tami LollCynthia Edgecombe is a 40 y.o. 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  Patient was in a usual state of health until One day ago when she developed sudden onset of sharp left-sided chest pain that began while she was gardening.  Pain radiated into the left shoulder down the left arm and under the left breast. She came to the emergency department but left before being seen.  Today she returns for evaluation. The pain was constant and not relieved until she took nitro in the ER today.  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  She states that she has an MI in 02/2015 with cath but no stents.  She has not followed up with cardiology since then.    Otherwise there has been no change in status. Patient has been taking medication as prescribed and there has been no recent change in medication or diet.  There has been no recent illness, travel or sick contacts.     EMS/ED COURSE:   Patient received aspirin, nitro and NS.  PAST MEDICAL HISTORY: Past Medical History:  Diagnosis Date  . CHF (congestive heart failure) (HCC)   . Coronary artery disease   . Hypertension   . Seizures (HCC)       PAST SURGICAL HISTORY: Past  Surgical History:  Procedure Laterality Date  . ABDOMINAL HYSTERECTOMY    . CESAREAN SECTION        SOCIAL HISTORY: Social History  Substance Use Topics  . Smoking status: Current Every Day Smoker  . Smokeless tobacco: Never Used  . Alcohol use No      FAMILY HISTORY: No family history on file.   MEDICATIONS AT HOME: Prior to Admission medications   Medication Sig Start Date End Date Taking? Authorizing Provider  cephALEXin (KEFLEX) 500 MG capsule Take 1 capsule (500 mg total) by mouth 3 (three) times daily. 09/24/15   Phineas SemenGraydon Goodman, MD  clindamycin (CLEOCIN) 300 MG capsule Take 1 capsule (300 mg total) by mouth 3 (three) times daily. 02/27/16   Ignacia Bayleyobert Tumey, PA-C  estradiol (ESTRACE) 0.5 MG tablet Take 0.5 mg by mouth at bedtime.      Historical Provider, MD  ibuprofen (ADVIL,MOTRIN) 800 MG tablet Take 1 tablet (800 mg total) by mouth every 8 (eight) hours as needed. 02/27/16   Ignacia Bayleyobert Tumey, PA-C  medroxyPROGESTERone (PROVERA) 2.5 MG tablet Take 2.5 mg by mouth at bedtime.      Historical Provider, MD  naproxen (NAPROSYN) 500 MG tablet Take 500 mg by mouth 2 (two) times daily with a meal.      Historical Provider, MD  oxyCODONE-acetaminophen (ROXICET) 5-325 MG tablet Take 1 tablet by mouth every 6 (six) hours as needed. 02/27/16   Ignacia Bayleyobert Tumey, PA-C  phenazopyridine (PYRIDIUM) 200 MG tablet Take 1 tablet (200 mg total) by mouth 3 (three) times daily as needed for pain. 09/24/15 09/23/16  Phineas Semen, MD      DRUG ALLERGIES: Allergies  Allergen Reactions  . Amoxicillin Swelling  . Penicillins Swelling  . Sulfa Antibiotics Rash     REVIEW OF SYSTEMS: CONSTITUTIONAL: No fatigue, weakness, fever, chills, weight gain/loss, headache EYES: No blurry or double vision. ENT: No tinnitus, postnasal drip, redness or soreness of the oropharynx. RESPIRATORY: No dyspnea, cough, wheeze, hemoptysis. CARDIOVASCULAR: Positive chest pain, orthopnea, palpitations,  syncope. GASTROINTESTINAL: No nausea, vomiting, constipation, diarrhea, abdominal pain. No hematemesis, melena or hematochezia. GENITOURINARY: No dysuria, frequency, hematuria. ENDOCRINE: No polyuria or nocturia. No heat or cold intolerance. HEMATOLOGY: No anemia, bruising, bleeding. INTEGUMENTARY: No rashes, ulcers, lesions. MUSCULOSKELETAL: No pain, arthritis, swelling, gout. NEUROLOGIC: No numbness, tingling, weakness or ataxia. No seizure-type activity. PSYCHIATRIC: No anxiety, depression, insomnia.  PHYSICAL EXAMINATION: VITAL SIGNS: Blood pressure 110/66, pulse (!) 54, temperature 98.5 F (36.9 C), temperature source Oral, resp. rate (!) 22, height 5\' 6"  (1.676 m), weight 56.7 kg (125 lb), SpO2 100 %.  GENERAL: 40 y.o.-year-old white female patient, well-developed, well-nourished lying in the bed in no acute distress.  Pleasant and cooperative.   HEENT: Head atraumatic, normocephalic. Pupils equal, round, reactive to light and accommodation. No scleral icterus. Extraocular muscles intact. Oropharynx is clear. Mucus membranes moist. NECK: Supple, full range of motion. No JVD, no bruit heard. No cervical lymphadenopathy. CHEST: Normal breath sounds bilaterally. No wheezing, rales, rhonchi or crackles. No use of accessory muscles of respiration.  There is significant reproducible chest wall tenderness at left AC, left sternal border, and left posterior scapular border.  CARDIOVASCULAR: S1, S2 normal. No murmurs, rubs, or gallops appreciated. Cap refill <2 seconds. ABDOMEN: Soft, nontender, nondistended. No rebound, guarding, rigidity. Normoactive bowel sounds present in all four quadrants. No organomegaly or mass. EXTREMITIES: Full range of motion. No pedal edema, cyanosis, or clubbing. NEUROLOGIC: Cranial nerves II through XII are grossly intact with no focal sensorimotor deficit. Muscle strength 5/5 in all extremities. Sensation intact. Gait not checked. PSYCHIATRIC: The patient is alert  and oriented x 3. Normal affect, mood, thought content. SKIN: Warm, dry, and intact without obvious rash, lesion, or ulcer.  LABORATORY PANEL:  CBC  Recent Labs Lab 09/15/16 1752  WBC 7.6  HGB 14.9  HCT 42.7  PLT 192   ----------------------------------------------------------------------------------------------------------------- Chemistries  Recent Labs Lab 09/15/16 1752  NA 139  K 3.4*  CL 104  CO2 31  GLUCOSE 96  BUN 9  CREATININE 0.89  CALCIUM 9.3   ------------------------------------------------------------------------------------------------------------------ Cardiac Enzymes  Recent Labs Lab 09/15/16 1752  TROPONINI <0.03   ------------------------------------------------------------------------------------------------------------------  RADIOLOGY: Dg Chest 2 View  Result Date: 09/15/2016 CLINICAL DATA:  Left-sided chest pain EXAM: CHEST  2 VIEW COMPARISON:  September 14, 2016 FINDINGS: No edema or consolidation. Heart size and pulmonary vascularity are normal. No adenopathy. No pneumothorax. No bone lesions. IMPRESSION: No edema or consolidation. Electronically Signed   By: Bretta Bang III M.D.   On: 09/15/2016 18:26   Dg Chest 2 View  Result Date: 09/14/2016 CLINICAL DATA:  Chest pain for 3 hours. EXAM: CHEST  2 VIEW COMPARISON:  PA and lateral chest 02/13/2015. FINDINGS: The lungs are clear. Heart size is normal. No pneumothorax or pleural effusion. No bony abnormality. IMPRESSION: No acute disease. Electronically Signed   By: Drusilla Kanner M.D.   On: 09/14/2016 21:26    EKG: unchanged.  Normal sinus rhythm at 87 bpm with normal axis and nonspecific ST-T wave changes.   IMPRESSION AND PLAN:  This is a 40 y.o. female  with a history of CAD s/p MI with 50% occlusion of mid-PDA 02/15/15, stress-induced cardiomyopathy, CHF, tobacco use disorder, HTN, seizures now being admitted with: 1. Chest pain rule out ACS with h/o CAD - admit tele observation,  trend trops, check lipids and TSH, NPO after midnight, cardio consult.  Patient already received aspirin and nitro in ED, will add metoprolol. Continue Lipitor 2. OA with reproducible chest wall pain - trial of Toradol for pain.  3. HTN - started metoprolol 4. Tobacco use disorder - cessation advised, nicotine patch. 5. Pain - Continue gabapentin   Diet/Nutrition: Heart healthy, NPO after midnight Fluids: IVNS DVT Px: Heparin, SCDs and early ambulation Code Status: Full  All the records are reviewed and case discussed with ED provider. Management plans discussed with the patient and/or family who express understanding and agree with plan of care.   TOTAL TIME TAKING CARE OF THIS PATIENT: 60 minutes.   Kashawn Manzano D.O. on 09/15/2016 at 9:19 PM Between 7am to 6pm - Pager - [phone] After 6pm go to [email or web address] - Social research officer, government [facility] Office [phone] CC: Primary care physician; Pcp Not In System     Note: This dictation was prepared with Dragon dictation along with smaller phrase technology. Any transcriptional errors that result from this process are unintentional.

## 2016-09-15 NOTE — ED Triage Notes (Signed)
C/O left sided chest pain radiating to left arm.  States fell down a flight of steps about 3-4 months ago and has history of arthritis to neck, unsure if pain is from muscle spasms.  Has been taking tramadol and ibuprofen for pain without relief.  Came to ED last evening and LWBS.

## 2016-09-15 NOTE — ED Provider Notes (Signed)
[facility] Emergency Department Provider Note  ____________________________________________  Time seen: Approximately 6:07 PM  I have reviewed the triage vital signs and the nursing notes.   HISTORY  Chief Complaint Neck Pain and Shoulder Pain    HPI Tami Davis is a 40 y.o. female with a history of CAD status post MI, CHF, ongoing tobacco abuse, early CAD family history, presenting with chest pain. The patient reports that at 6 PM yesterday she was gardening when she developed a sharp pain that radiated under the left breast up into the left shoulder and down the left arm. This pain has been constant since then. She waited approximately 3 hours at home and it did not improve so came to the emergency department but after 3 hours in the waiting room and left without being seen. Today, the pain persisted so she is here for further evaluation. She denies any associated shortness of breath, nausea or vomiting, palpitations or lightheadedness.   Past Medical History:  Diagnosis Date  . CHF (congestive heart failure) (HCC)   . Coronary artery disease   . Hypertension   . Seizures (HCC)     There are no active problems to display for this patient.   Past Surgical History:  Procedure Laterality Date  . ABDOMINAL HYSTERECTOMY    . CESAREAN SECTION      Current Outpatient Rx  . Order #: [id number] Class: Print  . Order #: [id number] Class: Print  . Order #: [id number] Class: Historical Med  . Order #: [id number] Class: Print  . Order #: [id number] Class: Historical Med  . Order #: [id number] Class: Historical Med  . Order #: [id number] Class: Print  . Order #: [id number] Class: Print    Allergies Amoxicillin; Penicillins; and Sulfa antibiotics  No family history on file.  Social History Social History  Substance Use Topics  . Smoking status: Current Every Day Smoker  . Smokeless tobacco: Never Used  . Alcohol use No    Review of Systems Constitutional: No  fever/chills.No lightheadedness or syncope. Eyes: No visual changes. ENT: No sore throat. No congestion or rhinorrhea. Cardiovascular: Positive chest pain. Denies palpitations. Respiratory: Denies shortness of breath.  No cough. Gastrointestinal: No abdominal pain.  No nausea, no vomiting.  No diarrhea.  No constipation. Genitourinary: Negative for dysuria. Musculoskeletal: Negative for back pain. No lower extremity swelling, calf pain. Skin: Negative for rash. Neurological: Negative for headaches. No focal numbness, tingling or weakness.   10-point ROS otherwise negative.  ____________________________________________   PHYSICAL EXAM:  VITAL SIGNS: ED Triage Vitals  Enc Vitals Group     BP 09/15/16 1750 127/82     Pulse Rate 09/15/16 1750 81     Resp 09/15/16 1750 18     Temp 09/15/16 1750 98.5 F (36.9 C)     Temp Source 09/15/16 1750 Oral     SpO2 09/15/16 1750 100 %     Weight 09/15/16 1751 125 lb (56.7 kg)     Height 09/15/16 1751 5\' 6"  (1.676 m)     Head Circumference --      Peak Flow --      Pain Score 09/15/16 1758 8     Pain Loc --      Pain Edu? --      Excl. in GC? --     Constitutional: Alert and oriented. Well appearing and in no acute distress. Answers questions appropriately. Eyes: Conjunctivae are normal.  EOMI. No scleral icterus. Head: Atraumatic. Nose: No congestion/rhinnorhea. Mouth/Throat: Mucous membranes are moist.  Neck:  No stridor.  Supple.  No JVD. Cardiovascular: Normal rate, regular rhythm. No murmurs, rubs or gallops.  Respiratory: Normal respiratory effort.  No accessory muscle use or retractions. Lungs CTAB.  No wheezes, rales or ronchi. Gastrointestinal: Soft, nontender and nondistended.  No guarding or rebound.  No peritoneal signs. Musculoskeletal: No LE edema. No ttp in the calves or palpable cords.  Negative Homan's sign. Neurologic:  A&Ox3.  Speech is clear.  Face and smile are symmetric.  EOMI.  Moves all extremities well. Skin:   Skin is warm, dry and intact. No rash noted. Psychiatric: Mood and affect are normal. Speech and behavior are normal.  Normal judgement.  ____________________________________________   LABS (all labs ordered are listed, but only abnormal results are displayed)  Labs Reviewed  BASIC METABOLIC PANEL - Abnormal; Notable for the following:       Result Value   Potassium 3.4 (*)    Anion gap 4 (*)    All other components within normal limits  CBC  TROPONIN I  BRAIN NATRIURETIC PEPTIDE   ____________________________________________  EKG  ED ECG REPORT I, Rockne MenghiniNorman, Anne-Caroline, the attending physician, personally viewed and interpreted this ECG.   Date: 09/15/2016  EKG Time: 1751  Rate: 87  Rhythm: normal sinus rhythm  Axis: normal   Intervals:none  ST&T Change: Nonspecific T-wave inversion in V1. No ST elevation. This EKG is compared to 09/14/16 and is grossly unchanged.  ____________________________________________  RADIOLOGY  Dg Chest 2 View  Result Date: 09/15/2016 CLINICAL DATA:  Left-sided chest pain EXAM: CHEST  2 VIEW COMPARISON:  September 14, 2016 FINDINGS: No edema or consolidation. Heart size and pulmonary vascularity are normal. No adenopathy. No pneumothorax. No bone lesions. IMPRESSION: No edema or consolidation. Electronically Signed   By: Bretta BangWilliam  Woodruff III M.D.   On: 09/15/2016 18:26   Dg Chest 2 View  Result Date: 09/14/2016 CLINICAL DATA:  Chest pain for 3 hours. EXAM: CHEST  2 VIEW COMPARISON:  PA and lateral chest 02/13/2015. FINDINGS: The lungs are clear. Heart size is normal. No pneumothorax or pleural effusion. No bony abnormality. IMPRESSION: No acute disease. Electronically Signed   By: Drusilla Kannerhomas  Dalessio M.D.   On: 09/14/2016 21:26    ____________________________________________   PROCEDURES  Procedure(s) performed: None  Procedures  Critical Care performed: No ____________________________________________   INITIAL IMPRESSION / ASSESSMENT  AND PLAN / ED COURSE  Pertinent labs & imaging results that were available during my care of the patient were reviewed by me and considered in my medical decision making (see chart for details).  40 y.o. female with a history of CAD status post MI, CHF, presenting with 24 hours of persistent sharp chest pain radiating down the left upper extremity. Overall, the patient is well-appearing with stable vital signs and a reassuring cardiopulmonary examination. She has no evidence of fluid overload on examination. However, she has significant cardiac risk factors and I'm concerned about her chest pain being from ACS or MI. Musculoskeletal pain, GI related pain including esophageal spasm or GERD, or also possible. I do not see evidence of fluid overload, so CHF is less likely. She is not hypoxic, tachycardic, and has no evidence of DVT so PE is very unlikely. Anticipate admission to the hospital for full cardiac workup with a risk stratification studies and she has not had one for more than 18 months.  ----------------------------------------- 8:20 PM on 09/15/2016 -----------------------------------------  At this time, the patient is completely chest pain-free after nitroglycerin. Even though she did not wait  to be evaluated yesterday, she has unchanged EKGs over the past 24 hours as well as 2 troponins 24 hours apart during her symptoms that are negative. I will plan to speak with the cardiologist for final disposition.  ----------------------------------------- 8:47 PM on 09/15/2016 -----------------------------------------  I've spoken with Dr. Antoine Poche, the cardiologist on-call today. Given the patient's risk factors, his recommendation is for overnight observation with risk stratification study in the morning, and to remain nothing by mouth overnight.  I've spoken to the hospitalist about admission.   ____________________________________________  FINAL CLINICAL IMPRESSION(S) / ED  DIAGNOSES  Final diagnoses:  Chest pain, unspecified type    Clinical Course      NEW MEDICATIONS STARTED DURING THIS VISIT:  New Prescriptions   No medications on file      Rockne Menghini, MD 09/15/16 2049

## 2016-09-16 LAB — LIPID PANEL
CHOL/HDL RATIO: 2.5 ratio
CHOLESTEROL: 103 mg/dL (ref 0–200)
HDL: 42 mg/dL (ref >40)
LDL Cholesterol: 44 mg/dL (ref 0–99)
Triglycerides: 87 mg/dL (ref <150)
VLDL: 17 mg/dL (ref 0–40)

## 2016-09-16 LAB — TROPONIN I
Troponin I: <0.03 ng/mL (ref <0.03)
Troponin I: <0.03 ng/mL (ref <0.03)
Troponin I: <0.03 ng/mL (ref <0.03)

## 2016-09-16 LAB — TSH: TSH: 1.355 u[IU]/mL (ref 0.350–4.500)

## 2016-09-16 NOTE — Discharge Summary (Signed)
[facility] [place] at [facility]   PATIENT NAME: Tami Davis    MR#:  [id number]  DATE OF BIRTH:  September 16, 1976  DATE OF ADMISSION:  09/15/2016 ADMITTING PHYSICIAN: Jon GillsAlexis Hugelmeyer, DO  DATE OF DISCHARGE: No discharge date for patient encounter.  PRIMARY CARE PHYSICIAN: Pcp Not In System    ADMISSION DIAGNOSIS:  Chest pain, unspecified type [R07.9]  DISCHARGE DIAGNOSIS:  Active Problems:   Chest pain, rule out acute myocardial infarction    Exercise stress test is negative- CAD ruled out.  SECONDARY DIAGNOSIS:   Past Medical History:  Diagnosis Date  . CHF (congestive heart failure) (HCC)   . Coronary artery disease   . Hypertension   . Seizures (HCC)     HOSPITAL COURSE:   Pt came with chest and left arm pain. She has Hx of CAD. Monitored on telemetry - negative serial troponin. Cardiologist ordered exercise stress test- which Is reported negative. I referred her to orthopedics for her left shoulder and arm pain.  DISCHARGE CONDITIONS:   Stable,  CONSULTS OBTAINED:  Treatment Team:  Marcina MillardAlexander Paraschos, MD Lamar BlinksBruce J Kowalski, MD  DRUG ALLERGIES:   Allergies  Allergen Reactions  . Metronidazole Other (See Comments)  . Peanut Oil Swelling    Swelling in throat.  Marland Kitchen. Amoxicillin Swelling  . Penicillins Swelling  . Sulfa Antibiotics Rash    DISCHARGE MEDICATIONS:   Current Discharge Medication List    CONTINUE these medications which have NOT CHANGED   Details  aspirin 81 MG chewable tablet Chew 81 mg by mouth daily.    atorvastatin (LIPITOR) 20 MG tablet Take 20 tablets by mouth daily.    gabapentin (NEURONTIN) 300 MG capsule Take 300 mg by mouth 3 (three) times daily.    metoprolol tartrate (LOPRESSOR) 25 MG tablet Take 25 mg by mouth 2 (two) times daily.         DISCHARGE INSTRUCTIONS:     Follow with Ortho clinic in 2 weeks.  If you experience worsening of your admission symptoms, develop shortness of breath,  life threatening emergency, suicidal or homicidal thoughts you must seek medical attention immediately by calling 911 or calling your MD immediately  if symptoms less severe.  You Must read complete instructions/literature along with all the possible adverse reactions/side effects for all the Medicines you take and that have been prescribed to you. Take any new Medicines after you have completely understood and accept all the possible adverse reactions/side effects.   Please note  You were cared for by a hospitalist during your hospital stay. If you have any questions about your discharge medications or the care you received while you were in the hospital after you are discharged, you can call the unit and asked to speak with the hospitalist on call if the hospitalist that took care of you is not available. Once you are discharged, your primary care physician will handle any further medical issues. Please note that NO REFILLS for any discharge medications will be authorized once you are discharged, as it is imperative that you return to your primary care physician (or establish a relationship with a primary care physician if you do not have one) for your aftercare needs so that they can reassess your need for medications and monitor your lab values.    Today   CHIEF COMPLAINT:   Chief Complaint  Patient presents with  . Neck Pain  . Shoulder Pain    HISTORY OF PRESENT ILLNESS:  Tami Davis  is  a 40 y.o. 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  Patient was in a usual state of health until One day ago when she developed sudden onset of sharp left-sided chest pain that began while she was gardening.  Pain radiated into the left shoulder down the left arm and under the left breast. She came to the emergency department but  left before being seen.  Today she returns for evaluation. The pain was constant and not relieved until she took nitro in the ER today.  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  She states that she has an MI in 02/2015 with cath but no stents.  She has not followed up with cardiology since then.    Otherwise there has been no change in status. Patient has been taking medication as prescribed and there has been no recent change in medication or diet.  There has been no recent illness, travel or sick contacts.   VITAL SIGNS:  Blood pressure 105/75, pulse 68, temperature 97.8 F (36.6 C), temperature source Oral, resp. rate 16, height 5\' 6"  (1.676 m), weight 52.7 kg (116 lb 3.2 oz), SpO2 98 %.  I/O:   Intake/Output Summary (Last 24 hours) at 09/16/16 1434 Last data filed at 09/16/16 0356  Gross per 24 hour  Intake              810 ml  Output              600 ml  Net              210 ml    PHYSICAL EXAMINATION:  GENERAL:  40 y.o.-year-old patient lying in the bed with no acute distress.  EYES: Pupils equal, round, reactive to light and accommodation. No scleral icterus. Extraocular muscles intact.  HEENT: Head atraumatic, normocephalic. Oropharynx and nasopharynx clear.  NECK:  Supple, no jugular venous distention. No thyroid enlargement, no tenderness.  LUNGS: Normal breath sounds bilaterally, no wheezing, rales,rhonchi or crepitation. No use of accessory muscles of respiration.  CARDIOVASCULAR: S1, S2 normal. No murmurs, rubs, or gallops.  ABDOMEN: Soft, non-tender, non-distended. Bowel sounds present. No organomegaly or mass.  EXTREMITIES: No pedal edema, cyanosis, or clubbing.  NEUROLOGIC: Cranial nerves II through XII are intact. Muscle strength 5/5 in all extremities. Sensation intact. Gait not checked.  PSYCHIATRIC: The patient is alert and oriented x 3.  SKIN: No obvious rash, lesion, or  ulcer.   DATA REVIEW:   CBC  Recent Labs Lab 09/15/16 1752  WBC 7.6  HGB 14.9  HCT 42.7  PLT 192    Chemistries   Recent Labs Lab 09/15/16 1752  NA 139  K 3.4*  CL 104  CO2 31  GLUCOSE 96  BUN 9  CREATININE 0.89  CALCIUM 9.3    Cardiac Enzymes  Recent Labs Lab 09/16/16 0412  TROPONINI <0.03    Microbiology Results  No results found for this or any previous visit.  RADIOLOGY:  Dg Chest 2 View  Result Date: 09/15/2016 CLINICAL DATA:  Left-sided chest pain EXAM: CHEST  2 VIEW COMPARISON:  September 14, 2016 FINDINGS: No edema or consolidation. Heart size and pulmonary vascularity are normal. No adenopathy. No pneumothorax. No bone lesions. IMPRESSION: No edema or consolidation. Electronically Signed   By:  Bretta Bang III M.D.   On: 09/15/2016 18:26   Dg Chest 2 View  Result Date: 09/14/2016 CLINICAL DATA:  Chest pain for 3 hours. EXAM: CHEST  2 VIEW COMPARISON:  PA and lateral chest 02/13/2015. FINDINGS: The lungs are clear. Heart size is normal. No pneumothorax or pleural effusion. No bony abnormality. IMPRESSION: No acute disease. Electronically Signed   By: Drusilla Kanner M.D.   On: 09/14/2016 21:26    EKG:   Orders placed or performed during the hospital encounter of 09/15/16  . EKG 12-Lead  . EKG 12-Lead  . ED EKG within 10 minutes  . ED EKG within 10 minutes  . EKG 12-Lead (at 6am)  . EKG 12-Lead (Repeat cardiac markers, recurrent chest pain)  . EKG 12-Lead (Repeat cardiac markers, recurrent chest pain)  . EKG 12-Lead (at 6am)      Management plans discussed with the patient, family and they are in agreement.  CODE STATUS:     Code Status Orders        Start     Ordered   09/15/16 2244  Full code  Continuous     09/15/16 2243    Code Status History    Date Active Date Inactive Code Status Order ID Comments User Context   This patient has a current code status but no historical code status.      TOTAL TIME TAKING CARE OF THIS  PATIENT: 35 minutes.    Altamese Dilling M.D on 09/16/2016 at 2:34 PM  Between 7am to 6pm - Pager - [phone]  After 6pm go to [email or web address] - password [email or web address]  [facility]  Office  [phone]  CC: Primary care physician; Pcp Not In System   Note: This dictation was prepared with Dragon dictation along with smaller phrase technology. Any transcriptional errors that result from this process are unintentional.

## 2016-09-16 NOTE — Discharge Instructions (Signed)
Follow with orthopedics clinic in 2 weeks.

## 2016-09-16 NOTE — Progress Notes (Signed)
[facility] Cardiology [facility] Encounter Note  Patient: Tami Davis / Admit Date: 09/15/2016 / Date of Encounter: 09/16/2016, 1:37 PM   Subjective: Patient has no further episodes of chest pain. EKG is still normal without evidence of elevation of troponin. Treadmill EKG with excellent exercise tolerance and no evidence of chest pain at peak stress with normal EKG and no evidence of myocardial ischemia or rhythm disturbances  Review of Systems: Positive for: [place] of breath Negative for: Vision change, hearing change, syncope, dizziness, nausea, vomiting,diarrhea, bloody stool, stomach pain, cough, congestion, diaphoresis, urinary frequency, urinary pain,skin lesions, skin rashes Others previously listed  Objective: Telemetry: Normal sinus rhythm Physical Exam: Blood pressure 105/75, pulse 68, temperature 97.8 F (36.6 C), temperature source Oral, resp. rate 16, height 5\' 6"  (1.676 m), weight 52.7 kg (116 lb 3.2 oz), SpO2 98 %. Body mass index is 18.76 kg/m. General: Well developed, well nourished, in no acute distress. Head: Normocephalic, atraumatic, sclera non-icteric, no xanthomas, nares are without discharge. Neck: No apparent masses Lungs: Normal respirations with no wheezes, no rhonchi, no rales , no crackles   Heart: Regular rate and rhythm, normal S1 S2, no murmur, no rub, no gallop, PMI is normal size and placement, carotid upstroke normal without bruit, jugular venous pressure normal Abdomen: Soft, non-tender, non-distended with normoactive bowel sounds. No hepatosplenomegaly. Abdominal aorta is normal size without bruit Extremities: No edema, no clubbing, no cyanosis, no ulcers,  Peripheral: 2+ radial, 2+ femoral, 2+ dorsal pedal pulses Neuro: Alert and oriented. Moves all extremities spontaneously. Psych:  Responds to questions appropriately with a normal affect.   Intake/Output Summary (Last 24 hours) at 09/16/16 1337 Last data filed at 09/16/16 0356  Gross per  24 hour  Intake              810 ml  Output              600 ml  Net              210 ml    Inpatient Medications:  . aspirin EC  81 mg Oral Daily  . atorvastatin  20 mg Oral q1800  . gabapentin  300 mg Oral TID  . heparin  5,000 Units Subcutaneous Q8H  . Influenza vac split quadrivalent PF  0.5 mL Intramuscular Tomorrow-1000  . metoprolol tartrate  25 mg Oral BID   Infusions:  . sodium chloride 75 mL/hr at 09/16/16 0903    Labs:  Recent Labs  09/14/16 2046 09/15/16 1752  NA 140 139  K 3.7 3.4*  CL 105 104  CO2 31 31  GLUCOSE 112* 96  BUN 10 9  CREATININE 1.01* 0.89  CALCIUM 9.2 9.3   No results for input(s): AST, ALT, ALKPHOS, BILITOT, PROT, ALBUMIN in the last 72 hours.  Recent Labs  09/14/16 2046 09/15/16 1752  WBC 8.2 7.6  HGB 14.7 14.9  HCT 43.3 42.7  MCV 92.4 90.5  PLT 207 192    Recent Labs  09/15/16 1752 09/16/16 0029 09/16/16 0137 09/16/16 0412  TROPONINI <0.03 <0.03 <0.03 <0.03   Invalid input(s): POCBNP No results for input(s): HGBA1C in the last 72 hours.   Weights: Filed Weights   09/15/16 1751 09/15/16 2239  Weight: 56.7 kg (125 lb) 52.7 kg (116 lb 3.2 oz)     Radiology/Studies:  Dg Chest 2 View  Result Date: 09/15/2016 CLINICAL DATA:  Left-sided chest pain EXAM: CHEST  2 VIEW COMPARISON:  September 14, 2016 FINDINGS: No edema or consolidation. Heart  size and pulmonary vascularity are normal. No adenopathy. No pneumothorax. No bone lesions. IMPRESSION: No edema or consolidation. Electronically Signed   By: Bretta BangWilliam  Woodruff III M.D.   On: 09/15/2016 18:26   Dg Chest 2 View  Result Date: 09/14/2016 CLINICAL DATA:  Chest pain for 3 hours. EXAM: CHEST  2 VIEW COMPARISON:  PA and lateral chest 02/13/2015. FINDINGS: The lungs are clear. Heart size is normal. No pneumothorax or pleural effusion. No bony abnormality. IMPRESSION: No acute disease. Electronically Signed   By: Drusilla Kannerhomas  Dalessio M.D.   On: 09/14/2016 21:26     Assessment and  Recommendation  40 y.o. female with known coronary artery disease essential hypertension mixed hyperlipidemia having atypical chest pain without evidence of myocardial infarction with a normal stress test  1. Continue metoprolol for hypertension control 2. Atorvastatin for high intensity cholesterol therapy 3. Aspirin for further cardiovascular risk reduction 4. No further cardiac diagnostics necessary at this time 5. Follow-up in 2 weeks for further assessment and treatment as necessary  Signed, Arnoldo HookerBruce Brandis Wixted M.D. FACC

## 2016-09-16 NOTE — Consult Note (Signed)
[facility] Consultation Note  Patient ID: Tami Davis, MRN: [id number], DOB/AGE: 03-14-1976 40 y.o. Admit date: 09/15/2016   Date of Consult: 09/16/2016 Primary Physician: Pcp Not In System Primary Cardiologist: None  Chief Complaint:  Chief Complaint  Patient presents with  . Neck Pain  . Shoulder Pain   Reason for Consult: chest pain  HPI: 40 y.o. 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 The patient does have some mild hypertension appropriately treated as well as lipid management appropriately treated. She has recently had episodes of left shoulder pain radiating into her arm as well as left upper chest doubling her over multiple times with associated shortness of breath. At this time the patient has not had any other syncope dizziness or nausea. The patient was seen in the emergency room with a normal EKG in true normal troponin. Currently the patient is comfortable with no significant symptoms.  Past Medical History:  Diagnosis Date  . CHF (congestive heart failure) (HCC)   . Coronary artery disease   . Hypertension   . Seizures [facility](HCC)       Surgical History:  Past Surgical History:  Procedure Laterality Date  . ABDOMINAL HYSTERECTOMY    . CESAREAN SECTION       Home Meds: Prior to Admission medications   Medication Sig Start Date End Date Taking? Authorizing Provider  estradiol (ESTRACE) 0.5 MG tablet Take 0.5 mg by mouth at bedtime.      Historical Provider, MD  medroxyPROGESTERone (PROVERA) 2.5 MG tablet Take 2.5 mg by mouth at bedtime.      Historical Provider, MD  naproxen (NAPROSYN) 500 MG tablet Take 500 mg by mouth 2 (two) times daily with a meal.      Historical Provider, MD    Inpatient Medications:  . aspirin EC  81 mg Oral Daily  . atorvastatin  20 mg Oral q1800  . gabapentin  300 mg Oral TID  . heparin  5,000 Units Subcutaneous Q8H  . Influenza vac  split quadrivalent PF  0.5 mL Intramuscular Tomorrow-1000  . metoprolol tartrate  25 mg Oral BID   . sodium chloride 75 mL/hr at 09/15/16 2252    Allergies:  Allergies  Allergen Reactions  . Metronidazole Other (See Comments)  . Peanut Oil Swelling    Swelling in throat.  Marland Kitchen. Amoxicillin Swelling  . Penicillins Swelling  . Sulfa Antibiotics Rash    Social History   Social History  . Marital status: Married    Spouse name: N/A  . Number of children: N/A  . Years of education: N/A   Occupational History  . Not on file.   Social History Main Topics  . Smoking status: Current Every Day Smoker  . Smokeless tobacco: Never Used  . Alcohol use No  . Drug use: No  . Sexual activity: Not on file   Other Topics Concern  . Not on file   Social History Narrative  . No narrative on file     No family history on file.   Review of Systems Positive for Chest and arm pain Negative for: General:  chills, fever, night sweats or weight changes.  Cardiovascular: PND orthopnea syncope dizziness  Dermatological skin lesions rashes Respiratory: Cough congestion Urologic: Frequent urination urination at night and hematuria Abdominal: negative for nausea, vomiting, diarrhea, bright red blood per rectum, melena, or hematemesis Neurologic: negative for visual changes, and/or hearing changes  All other systems reviewed  and are otherwise negative except as noted above.  Labs:  Recent Labs  09/15/16 1752 09/16/16 0029 09/16/16 0137 09/16/16 0412  TROPONINI <0.03 <0.03 <0.03 <0.03   Lab Results  Component Value Date   WBC 7.6 09/15/2016   HGB 14.9 09/15/2016   HCT 42.7 09/15/2016   MCV 90.5 09/15/2016   PLT 192 09/15/2016    Recent Labs Lab 09/15/16 1752  NA 139  K 3.4*  CL 104  CO2 31  BUN 9  CREATININE 0.89  CALCIUM 9.3  GLUCOSE 96   Lab Results  Component Value Date   CHOL 103 09/16/2016   HDL 42 09/16/2016   LDLCALC 44 09/16/2016   TRIG 87 09/16/2016   No  results found for: DDIMER  Radiology/Studies:  Dg Chest 2 View  Result Date: 09/15/2016 CLINICAL DATA:  Left-sided chest pain EXAM: CHEST  2 VIEW COMPARISON:  September 14, 2016 FINDINGS: No edema or consolidation. Heart size and pulmonary vascularity are normal. No adenopathy. No pneumothorax. No bone lesions. IMPRESSION: No edema or consolidation. Electronically Signed   By: Bretta Bang III M.D.   On: 09/15/2016 18:26   Dg Chest 2 View  Result Date: 09/14/2016 CLINICAL DATA:  Chest pain for 3 hours. EXAM: CHEST  2 VIEW COMPARISON:  PA and lateral chest 02/13/2015. FINDINGS: The lungs are clear. Heart size is normal. No pneumothorax or pleural effusion. No bony abnormality. IMPRESSION: No acute disease. Electronically Signed   By: Drusilla Kanner M.D.   On: 09/14/2016 21:26    EKG: Normal sinus rhythm  Weights: Filed Weights   09/15/16 1751 09/15/16 2239  Weight: 56.7 kg (125 lb) 52.7 kg (116 lb 3.2 oz)     Physical Exam: Blood pressure 105/75, pulse 68, temperature 97.8 F (36.6 C), temperature source Oral, resp. rate 16, height 5\' 6"  (1.676 m), weight 52.7 kg (116 lb 3.2 oz), SpO2 98 %. Body mass index is 18.76 kg/m. General: Well developed, well nourished, in no acute distress. Head eyes ears nose throat: Normocephalic, atraumatic, sclera non-icteric, no xanthomas, nares are without discharge. No apparent thyromegaly and/or mass  Lungs: Normal respiratory effort.  no wheezes, no rales, no rhonchi.  Heart: RRR with normal S1 S2. no murmur gallop, no rub, PMI is normal size and placement, carotid upstroke normal without bruit, jugular venous pressure is normal Abdomen: Soft, non-tender, non-distended with normoactive bowel sounds. No hepatomegaly. No rebound/guarding. No obvious abdominal masses. Abdominal aorta is normal size without bruit Extremities: No edema. no cyanosis, no clubbing, no ulcers  Peripheral : 2+ bilateral upper extremity pulses, 2+ bilateral femoral pulses, 2+  bilateral dorsal pedal pulse Neuro: Alert and oriented. No facial asymmetry. No focal deficit. Moves all extremities spontaneously. Musculoskeletal: Normal muscle tone without kyphosis Psych:  Responds to questions appropriately with a normal affect.    Assessment: 40 year old female with known previous stress-induced cardiomyopathy essential hypertension and coronary atherosclerosis on appropriate medication management and with atypical left upper chest discomfort without evidence of myocardial infarction  Plan: 1. Continue telemetry and serial ECG and enzymes to assess for possible myocardial infarction 2. Continue hypertension control with beta blocker 3. Aspirin 4. High intensity cholesterol therapy with atorvastatin 5. Consider treadmill EKG to assess exercise tolerance chest pain and/or myocardial perfusion for further evaluation and treatment options  Signed, Lamar Blinks M.D. [facility] Card[facility] 09/16/2016, 8:52 AM

## 2016-09-16 NOTE — Plan of Care (Signed)
Problem: Nutrition: Goal: Adequate nutrition will be maintained Outcome: Progressing Nutrition progressing  Problem: Bowel/Gastric: Goal: Will not experience complications related to bowel motility Outcome: Progressing No problems noted  Comments: Patient had negative stress test- patient being d/c with a consult to see a ortho Md specialist.

## 2016-09-18 ENCOUNTER — Inpatient Hospital Stay
Admission: RE | Admit: 2016-09-18 | Discharge: 2016-09-18 | Disposition: A | Discharge status: Home / Self Care | Payer: Medicaid Other | Source: Ambulatory Visit | Attending: Internal Medicine | Admitting: Internal Medicine

## 2016-09-18 ENCOUNTER — Other Ambulatory Visit: Payer: Self-pay | Admitting: Internal Medicine

## 2016-09-18 DIAGNOSIS — R079 Chest pain, unspecified: Secondary | ICD-10-CM

## 2018-08-13 ENCOUNTER — Other Ambulatory Visit: Payer: Self-pay

## 2018-08-13 ENCOUNTER — Encounter: Payer: Self-pay | Admitting: Emergency Medicine

## 2018-08-13 ENCOUNTER — Emergency Department
Admission: EM | Admit: 2018-08-13 | Discharge: 2018-08-13 | Disposition: A | Discharge status: Home / Self Care | Payer: Medicaid Other | Attending: Emergency Medicine | Admitting: Emergency Medicine

## 2018-08-13 DIAGNOSIS — Z7982 Long term (current) use of aspirin: Secondary | ICD-10-CM | POA: Diagnosis not present

## 2018-08-13 DIAGNOSIS — M545 Low back pain: Secondary | ICD-10-CM | POA: Diagnosis present

## 2018-08-13 DIAGNOSIS — I2510 Atherosclerotic heart disease of native coronary artery without angina pectoris: Secondary | ICD-10-CM | POA: Insufficient documentation

## 2018-08-13 DIAGNOSIS — M5441 Lumbago with sciatica, right side: Secondary | ICD-10-CM | POA: Insufficient documentation

## 2018-08-13 DIAGNOSIS — I509 Heart failure, unspecified: Secondary | ICD-10-CM | POA: Diagnosis not present

## 2018-08-13 DIAGNOSIS — I110 Hypertensive heart disease with heart failure: Secondary | ICD-10-CM | POA: Insufficient documentation

## 2018-08-13 DIAGNOSIS — F17210 Nicotine dependence, cigarettes, uncomplicated: Secondary | ICD-10-CM | POA: Diagnosis not present

## 2018-08-13 DIAGNOSIS — Z79899 Other long term (current) drug therapy: Secondary | ICD-10-CM | POA: Diagnosis not present

## 2018-08-13 MED ORDER — HYDROCODONE-ACETAMINOPHEN 5-325 MG PO TABS: 1.0000 | ORAL_TABLET | Freq: Four times a day (QID) | ORAL | 0 refills | Status: AC | PRN

## 2018-08-13 MED ORDER — DEXAMETHASONE SODIUM PHOSPHATE 10 MG/ML IJ SOLN
10.0000 mg | Freq: Once | INTRAMUSCULAR | Status: AC
  Administered 2018-08-13: 10 mg via INTRAMUSCULAR
  Filled 2018-08-13: qty 1

## 2018-08-13 MED ORDER — METHOCARBAMOL 500 MG PO TABS
1000.0000 mg | ORAL_TABLET | Freq: Once | ORAL | Status: AC
  Administered 2018-08-13: 1000 mg via ORAL
  Filled 2018-08-13: qty 2

## 2018-08-13 MED ORDER — METHOCARBAMOL 500 MG PO TABS: ORAL_TABLET | ORAL | 0 refills | Status: AC

## 2018-08-13 MED ORDER — PREDNISONE 10 MG PO TABS: ORAL_TABLET | ORAL | 0 refills | Status: AC

## 2018-08-13 NOTE — Discharge Instructions (Addendum)
Follow-up with your primary care provider if any continued problems.  Use ice or heat to your back as needed for discomfort.  Today in the emergency department you were given Robaxin which is a muscle relaxant and also an injection of Decadron which should help with the sciatic symptoms.  Also begin with prescription prednisone tapering down as directed, Robaxin 1 or 2 every 6 hours as needed for muscle spasms and Norco 1 tablet every 6 hours as needed for severe pain.  Do not drive or operate machinery while taking the Robaxin and Norco.  You may take Tylenol with these medications when not taking the Norco as it has Tylenol in with the hydrocodone.

## 2018-08-13 NOTE — ED Triage Notes (Signed)
Low back pain x 3 days. No fall or injury.

## 2018-08-13 NOTE — ED Provider Notes (Signed)
[facility] Emergency Department Provider Note  ____________________________________________   First MD Initiated Contact with Patient 08/13/18 1420     (approximate)  I have reviewed the triage vital signs and the nursing notes.   HISTORY  Chief Complaint Back Pain   HPI Tami Davis is a 42 y.o. female sent to the emergency department with complaint of low back pain with radiation into her right leg.  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   [place] here [place] her daughter who is expecting to go into labor most of the day.  Patient has been moving boxes and other items to help her daughter out.  Patient denies any urinary symptoms, incontinence of bowel or bladder or saddle anesthesias.  Patient continues to ambulate without any assistance.  Rates her pain as 9/10.   Past Medical History:  Diagnosis Date  . CHF (congestive heart failure) (HCC)   . Coronary artery disease   . Hypertension   . Seizures [facility](HCC)     Patient Active Problem List   Diagnosis Date Noted  . Chest pain, rule out acute myocardial infarction 09/15/2016    Past Surgical History:  Procedure Laterality Date  . ABDOMINAL HYSTERECTOMY    . CESAREAN SECTION      Prior to Admission medications   Medication Sig Start Date End Date Taking? Authorizing Provider  aspirin 81 MG chewable tablet Chew 81 mg by mouth daily.    [provider]  atorvastatin (LIPITOR) 20 MG tablet Take 20 tablets by mouth daily.    [provider]  gabapentin (NEURONTIN) 300 MG capsule Take 300 mg by mouth 3 (three) times daily. 07/31/14   [provider]  HYDROcodone-acetaminophen (NORCO/VICODIN) 5-325 MG tablet Take 1 tablet by mouth every 6 (six) hours as needed for severe pain. 08/13/18   Tommi RumpsSummers, Glendel Jaggers L, PA-C  methocarbamol (ROBAXIN) 500 MG tablet 1-2 every 6 hours prn muscle spasms 08/13/18    Bridget HartshornSummers, Abdurahman Rugg L, PA-C  metoprolol tartrate (LOPRESSOR) 25 MG tablet Take 25 mg by mouth 2 (two) times daily.    [provider]  predniSONE (DELTASONE) 10 MG tablet Take 3 tablets once a day for 2 days, 2 tablets once a day for 2 days, and 1 tablet once a day for 2 days. 08/13/18   Tommi RumpsSummers, Loraine Bhullar L, PA-C    Allergies Metronidazole; Peanut oil; Amoxicillin; Penicillins; and Sulfa antibiotics  No family history on file.  Social History Social History   Tobacco Use  . Smoking status: Current Every Day Smoker  . Smokeless tobacco: Never Used  Substance Use Topics  . Alcohol use: No  . Drug use: No    Review of Systems Constitutional: No fever/chills Cardiovascular: Denies chest pain. Respiratory: Denies shortness of breath. Gastrointestinal: No abdominal pain.  No nausea, no vomiting.  No diarrhea.  Genitourinary: Negative for dysuria. Musculoskeletal: Positive for chronic low back pain.  Positive for acute exacerbation of chronic back pain. Skin: Negative for rash. Neurological: Negative for headaches, focal weakness or numbness. ____________________________________________   PHYSICAL EXAM:  VITAL SIGNS: ED Triage Vitals  Enc Vitals Group     BP 08/13/18 1404 (!) 144/95     Pulse Rate 08/13/18 1404 (!) 120     Resp 08/13/18 1404 18     Temp 08/13/18 1404 98.6 F (37 C)     Temp Source 08/13/18 1404 Oral     SpO2  08/13/18 1404 97 %     Weight 08/13/18 1405 115 lb (52.2 kg)     Height 08/13/18 1405 5\' 6"  (1.676 m)     Head Circumference --      Peak Flow --      Pain Score 08/13/18 1405 9     Pain Loc --      Pain Edu? --      Excl. in GC? --    Constitutional: Alert and oriented. Well appearing and in no acute distress. Eyes: Conjunctivae are normal.  Head: Atraumatic. Neck: No stridor.   Cardiovascular: Normal rate, regular rhythm. Grossly normal heart sounds.  Good peripheral circulation. Respiratory: Normal respiratory effort.  No retractions.  Lungs CTAB. Musculoskeletal: Patient is sitting in a guarded position and avoiding any movement which increases her pain.  There is no point tenderness on palpation of the lumbar spine however there is moderate tenderness on palpation of the right paraspinous muscles and right SI joint area.  Straight leg raises are negative at approximately 90 degrees.  Patient has good muscle strength lower extremities bilaterally. Neurologic:  Normal speech and language. No gross focal neurologic deficits are appreciated.  Reflexes 2+ bilaterally.  No gait instability. Skin:  Skin is warm, dry and intact. No rash noted. Psychiatric: Mood and affect are normal. Speech and behavior are normal.  ____________________________________________   LABS (all labs ordered are listed, but only abnormal results are displayed)  Labs Reviewed - No data to display  RADIOLOGY  Official radiology report(s): Deferred ____________________________________________   PROCEDURES  Procedure(s) performed: None  Procedures  Critical Care performed: No  ____________________________________________   INITIAL IMPRESSION / ASSESSMENT AND PLAN / ED COURSE  As part of my medical decision making, I reviewed the following data within the electronic MEDICAL RECORD NUMBER Notes from prior ED visits and [place] Controlled Substance Database  Presents to the emergency department with complaint of lower back pain with radiation into her right lower extremity.  Patient denies any recent injury.  She has had problems with her back in the past and currently takes gabapentin.  She is here from [place] visiting her daughter who is pregnant and due any day.  Patient was given Decadron 10 mg IM along with methocarbamol 1000 mg p.o.  Patient is to continue with a tapering dose of prednisone, methocarbamol 1 or 2 every 6 hours as needed for muscle spasms and Norco 1 every 6 hours if needed for severe pain.  She is encouraged to use ice or heat to her  back as needed for discomfort.  She will follow-up with her PCP when she arrives home or if any worsening return to the emergency department. ____________________________________________   FINAL CLINICAL IMPRESSION(S) / ED DIAGNOSES  Final diagnoses:  Acute right-sided low back pain with right-sided sciatica     ED Discharge Orders         Ordered    predniSONE (DELTASONE) 10 MG tablet     08/13/18 1545    methocarbamol (ROBAXIN) 500 MG tablet     08/13/18 1545    HYDROcodone-acetaminophen (NORCO/VICODIN) 5-325 MG tablet  Every 6 hours PRN     08/13/18 1545           Note:  This document was prepared using Dragon voice recognition software and may include unintentional dictation errors.    Tommi Rumps, PA-C 08/13/18 1642    Nita Sickle, MD 08/14/18 1329

## 2018-08-13 NOTE — ED Notes (Signed)
Pt states unable to give urine sample at this time
